# Patient Record
Sex: Female | Born: 1960
Health system: Southern US, Community
[De-identification: ages and names within clinical notes are randomized; demographics above are authoritative.]

## PROBLEM LIST (undated history)

## (undated) DIAGNOSIS — I10 Essential (primary) hypertension: Secondary | ICD-10-CM

## (undated) DIAGNOSIS — E119 Type 2 diabetes mellitus without complications: Secondary | ICD-10-CM

## (undated) HISTORY — PX: ABDOMINAL HYSTERECTOMY: SHX81

---

## 1997-09-17 ENCOUNTER — Other Ambulatory Visit: Admission: RE | Admit: 1997-09-17 | Discharge: 1997-09-17 | Payer: Self-pay | Admitting: Obstetrics & Gynecology

## 1998-03-20 ENCOUNTER — Other Ambulatory Visit: Admission: RE | Admit: 1998-03-20 | Discharge: 1998-03-20 | Payer: Self-pay | Admitting: Otolaryngology

## 1998-07-19 ENCOUNTER — Encounter: Admission: RE | Admit: 1998-07-19 | Discharge: 1998-10-17 | Payer: Self-pay | Admitting: Internal Medicine

## 1998-09-26 ENCOUNTER — Other Ambulatory Visit: Admission: RE | Admit: 1998-09-26 | Discharge: 1998-09-26 | Payer: Self-pay | Admitting: Obstetrics & Gynecology

## 1999-11-25 ENCOUNTER — Other Ambulatory Visit: Admission: RE | Admit: 1999-11-25 | Discharge: 1999-11-25 | Payer: Self-pay | Admitting: Obstetrics & Gynecology

## 2000-04-22 ENCOUNTER — Ambulatory Visit (HOSPITAL_COMMUNITY): Admission: RE | Admit: 2000-04-22 | Discharge: 2000-04-22 | Payer: Self-pay | Admitting: Gastroenterology

## 2000-04-29 ENCOUNTER — Encounter: Payer: Self-pay | Admitting: Gastroenterology

## 2000-04-29 ENCOUNTER — Encounter: Admission: RE | Admit: 2000-04-29 | Discharge: 2000-04-29 | Payer: Self-pay | Admitting: Gastroenterology

## 2000-05-11 ENCOUNTER — Ambulatory Visit (HOSPITAL_COMMUNITY): Admission: RE | Admit: 2000-05-11 | Discharge: 2000-05-11 | Payer: Self-pay | Admitting: Gastroenterology

## 2000-05-11 ENCOUNTER — Encounter: Payer: Self-pay | Admitting: Gastroenterology

## 2000-05-25 ENCOUNTER — Other Ambulatory Visit: Admission: RE | Admit: 2000-05-25 | Discharge: 2000-05-25 | Payer: Self-pay | Admitting: General Surgery

## 2000-05-25 ENCOUNTER — Encounter (INDEPENDENT_AMBULATORY_CARE_PROVIDER_SITE_OTHER): Payer: Self-pay | Admitting: Specialist

## 2000-08-17 ENCOUNTER — Other Ambulatory Visit: Admission: RE | Admit: 2000-08-17 | Discharge: 2000-08-17 | Payer: Self-pay | Admitting: Obstetrics & Gynecology

## 2000-08-17 ENCOUNTER — Encounter (INDEPENDENT_AMBULATORY_CARE_PROVIDER_SITE_OTHER): Payer: Self-pay | Admitting: Specialist

## 2000-10-09 ENCOUNTER — Emergency Department (HOSPITAL_COMMUNITY): Admission: EM | Admit: 2000-10-09 | Discharge: 2000-10-09 | Payer: Self-pay | Admitting: Emergency Medicine

## 2000-10-09 ENCOUNTER — Encounter: Payer: Self-pay | Admitting: Emergency Medicine

## 2001-11-04 ENCOUNTER — Encounter: Payer: Self-pay | Admitting: Emergency Medicine

## 2001-11-04 ENCOUNTER — Emergency Department (HOSPITAL_COMMUNITY): Admission: EM | Admit: 2001-11-04 | Discharge: 2001-11-04 | Payer: Self-pay | Admitting: Emergency Medicine

## 2001-11-11 ENCOUNTER — Ambulatory Visit (HOSPITAL_COMMUNITY): Admission: RE | Admit: 2001-11-11 | Discharge: 2001-11-11 | Payer: Self-pay | Admitting: Orthopedic Surgery

## 2001-11-11 ENCOUNTER — Encounter: Payer: Self-pay | Admitting: Orthopedic Surgery

## 2001-11-15 ENCOUNTER — Ambulatory Visit (HOSPITAL_BASED_OUTPATIENT_CLINIC_OR_DEPARTMENT_OTHER): Admission: RE | Admit: 2001-11-15 | Discharge: 2001-11-15 | Payer: Self-pay | Admitting: Orthopedic Surgery

## 2002-05-09 ENCOUNTER — Other Ambulatory Visit: Admission: RE | Admit: 2002-05-09 | Discharge: 2002-05-09 | Payer: Self-pay | Admitting: Obstetrics & Gynecology

## 2003-06-06 ENCOUNTER — Other Ambulatory Visit: Admission: RE | Admit: 2003-06-06 | Discharge: 2003-06-06 | Payer: Self-pay | Admitting: Obstetrics & Gynecology

## 2003-08-14 ENCOUNTER — Ambulatory Visit (HOSPITAL_COMMUNITY): Admission: RE | Admit: 2003-08-14 | Discharge: 2003-08-14 | Payer: Self-pay | Admitting: Obstetrics & Gynecology

## 2004-07-30 ENCOUNTER — Other Ambulatory Visit: Admission: RE | Admit: 2004-07-30 | Discharge: 2004-07-30 | Payer: Self-pay | Admitting: Obstetrics & Gynecology

## 2004-10-14 ENCOUNTER — Other Ambulatory Visit: Admission: RE | Admit: 2004-10-14 | Discharge: 2004-10-14 | Payer: Self-pay | Admitting: Obstetrics & Gynecology

## 2004-12-22 ENCOUNTER — Ambulatory Visit (HOSPITAL_COMMUNITY): Admission: RE | Admit: 2004-12-22 | Discharge: 2004-12-22 | Payer: Self-pay | Admitting: Gastroenterology

## 2005-03-04 ENCOUNTER — Ambulatory Visit (HOSPITAL_COMMUNITY): Admission: RE | Admit: 2005-03-04 | Discharge: 2005-03-04 | Payer: Self-pay | Admitting: Gastroenterology

## 2005-03-04 ENCOUNTER — Encounter (INDEPENDENT_AMBULATORY_CARE_PROVIDER_SITE_OTHER): Payer: Self-pay | Admitting: Specialist

## 2005-03-23 ENCOUNTER — Ambulatory Visit: Payer: Self-pay | Admitting: Hematology and Oncology

## 2005-04-10 LAB — CBC WITH DIFFERENTIAL/PLATELET
BASO%: 0.8 % (ref 0.0–2.0)
EOS%: 2 % (ref 0.0–7.0)
MCH: 29.2 pg (ref 26.0–34.0)
MCHC: 33.8 g/dL (ref 32.0–36.0)
MCV: 86.4 fL (ref 81.0–101.0)
MONO%: 4.5 % (ref 0.0–13.0)
NEUT#: 5.1 10*3/uL (ref 1.5–6.5)
RBC: 4.62 10*6/uL (ref 3.70–5.32)
RDW: 14.7 % — ABNORMAL HIGH (ref 11.3–14.5)

## 2006-08-21 ENCOUNTER — Emergency Department (HOSPITAL_COMMUNITY): Admission: EM | Admit: 2006-08-21 | Discharge: 2006-08-21 | Payer: Self-pay | Admitting: Emergency Medicine

## 2007-04-17 ENCOUNTER — Emergency Department (HOSPITAL_COMMUNITY): Admission: EM | Admit: 2007-04-17 | Discharge: 2007-04-17 | Payer: Self-pay | Admitting: Emergency Medicine

## 2007-05-02 ENCOUNTER — Emergency Department (HOSPITAL_COMMUNITY): Admission: EM | Admit: 2007-05-02 | Discharge: 2007-05-02 | Payer: Self-pay | Admitting: Emergency Medicine

## 2007-05-06 ENCOUNTER — Encounter: Admission: RE | Admit: 2007-05-06 | Discharge: 2007-05-06 | Payer: Self-pay | Admitting: Neurosurgery

## 2007-09-19 ENCOUNTER — Encounter: Admission: RE | Admit: 2007-09-19 | Discharge: 2007-09-19 | Payer: Self-pay | Admitting: Obstetrics and Gynecology

## 2008-07-03 ENCOUNTER — Emergency Department: Payer: Self-pay | Admitting: Emergency Medicine

## 2008-09-20 ENCOUNTER — Encounter: Admission: RE | Admit: 2008-09-20 | Discharge: 2008-09-20 | Payer: Self-pay | Admitting: Obstetrics and Gynecology

## 2010-05-23 NOTE — Op Note (Signed)
Jenna Nelson, Jenna Nelson                           ACCOUNT NO.:  [id number]   MEDICAL RECORD NO.:  [id number]                   PATIENT TYPE:  AMB   LOCATION:  DSC                                  FACILITY:  [facility]   PHYSICIAN:  Jenna Fitch. Naaman Plummer., M.D.          DATE OF BIRTH:  06-01-1960   DATE OF PROCEDURE:  11/15/2001  DATE OF DISCHARGE:                                 OPERATIVE REPORT   PREOPERATIVE DIAGNOSIS:  Acute gamekeeper's thumb injury, right thumb  metacarpophalangeal joint, due to on-the-job fall, with greater than 35  degrees radial deviation instability of metacarpophalangeal joint and  positive MRI documenting rupture of the distal insertion of the ulnar  collateral ligament.   POSTOPERATIVE DIAGNOSIS:  Acute gamekeeper's thumb injury, right thumb  metacarpophalangeal joint, due to on-the-job fall, with greater than 35  degrees radial deviation instability of metacarpophalangeal joint and  positive MRI documenting rupture of the distal insertion of the ulnar  collateral ligament.   PROCEDURE:  Reconstruction of right thumb ulnar collateral ligament distal  insertion with a 3-0 Kevlar through-bone suture.   SURGEON:  Jenna Fitch. Sypher, M.D.   ASSISTANT:  Jonni Sanger, P.A.   ANESTHESIA:  General by LMA supervised by the anesthesiologist, Maren Beach, M.D.   INDICATIONS:  The patient is a 50 year old employee of the [employer].   Approximately one week ago she fell full weightbearing onto her right hand  while on the job, sustaining a marked radial abduction injury to the  metacarpophalangeal joint.   She had acute pain, swelling, and discoloration consistent with marked  bruising.   She presented for evaluation and was noted on plain films to have no sign of  fracture.  She had frank instability and a palpable mass consistent with an  avulsed distal ulnar collateral ligament insertion.   She was referred for an MRI.   She was  noted to have a complete avulsion of the distal insertion of the  ulnar collateral ligament; however, she did not have a classic Stener  lesion.  She did, however, show marked radial deviation instability;  therefore, she is brought to the operating room at this time for  reconstruction of her right dominant thumb ulnar collateral ligament.   DESCRIPTION OF PROCEDURE:  The patient is brought to the operating room and  placed in the supine position on the operating table.  Following induction  of general anesthesia by LMA, the right arm was prepped with Betadine soap  and solution and sterilely draped.   Following exsanguination of the limb with an Esmarch bandage, an arterial  tourniquet on the proximal brachium was inflated to 220 mmHg.  The procedure  commenced with a lazy S incision exposing the ulnar aspect of the right  thumb metacarpophalangeal joint and extensor retinaculum.   The transverse retinacular fibers of the adductor were identified and the  neurovascular structures gently retracted.  Care was taken to gently retract  the radial sensory branches.   The avulsed ligament was palpable.   The adductor tendon was taken down sharply and the avulsion of the distal  insertion of the ulnar collateral ligament easily visualized.   Granulation tissues were cleaned from the site of the avulsion, followed by  use of a microcurette to decorticate the bone at the insertion point.   The ulnar collateral ligament was gathered with a 3-0 Fibrewire Kevlar  suture with grasping technique, followed by placement of two drill holes  across the proximal phalanx utilizing a 0.045 inch Kirschner wire, followed  by use of a Keith needle.   A small counter incision was fashioned on the radial aspect of the thumb  proximal phalanx, used to tension the sutures.   The repair was replaced anatomically with excellent tension and inset into  the decorticated bone cavity.   Prior to tying the  sutures, the joint was fixed in slight ulnar deviation in  approximately 15 degrees flexion and secured with a 0.045 inch Kirschner  wire across the metacarpal head into the proximal phalanx.   The suture was then tensioned appropriately and the repair was noted to be  anatomic.   The adductor tendon and transverse retinacular fibers were then repaired  with mattress sutures of 4-0 Mersilene.  The skin was repaired with  intradermal 3-0 Prolene.   A compressive dressing was applied with a volar plaster splint supporting  the wrist in 15 degrees dorsiflexion and a thumb spica splint protecting the  MP joint.  There were no apparent complications.  The patient tolerated the  surgery and anesthesia well.  She was transferred to the recovery room with  stable vital signs.                                               Jenna Fitch Naaman Plummer., M.D.    RVS/MEDQ  D:  11/15/2001  T:  11/16/2001  Job:  [id number]

## 2010-05-23 NOTE — Op Note (Signed)
Jenna Nelson, Jenna Nelson               ACCOUNT NO.:  [id number]   MEDICAL RECORD NO.:  [id number]          PATIENT TYPE:  AMB   LOCATION:  ENDO                         FACILITY:  [facility]   PHYSICIAN:  Anselmo Rod, M.D.  DATE OF BIRTH:  August 23, 1960   DATE OF PROCEDURE:  03/04/2005  DATE OF DISCHARGE:                                 OPERATIVE REPORT   PROCEDURE PERFORMED:  Colonoscopy.   ENDOSCOPIST:  Anselmo Rod, M.D.   INSTRUMENT USED:  Olympus video colonoscope.   INDICATIONS FOR PROCEDURE:  A 50 year old white female with a history of  iron deficiency anemia undergoing colonoscopy to rule out colonic polyps,  masses, etc.   PREPROCEDURE PREPARATION:  Informed consent was procured from the patient.  The patient was fasted for four hours prior to the procedure after being  prepped with OsmoPrep pills the night of and the morning of the procedure.  The risks and benefits of the procedure including a 10% miss rate for cancer  or polyps was discussed with the patient as well.   PREPROCEDURE PHYSICAL:  The patient had stable vital signs.  Neck supple.  Chest clear to auscultation.  S1 and S2 regular.  Abdomen soft with normal  bowel sounds.   DESCRIPTION OF PROCEDURE:  The patient was placed in left lateral decubitus  position and sedated with an additional 50 mcg of fentanyl.  Once the  patient was adequately sedated and maintained on low flow oxygen and  continuous cardiac monitoring, the Olympus video colonoscope was advanced  from the rectum to the cecum.  The appendicular orifice and ileocecal valve  were clearly visualized and photographed.  The terminal ileum appeared  healthy and without lesions.  No masses, polyps, erosions, ulcerations or  diverticula were seen.  Small internal hemorrhoids were seen on retroflexion  in the rectum.  The patient tolerated the procedure well without  complication.   IMPRESSION:  1.  Normal colonoscopy up to the terminal ileum except for  small nonbleeding      internal hemorrhoids.  No masses, polyps, or diverticula seen.   RECOMMENDATIONS:  1.  Continue high fiber diet with liberal fluid intake.  2.  Repeat colonoscopy is recommended in the next 10 years unless the      patient develops any abnormal symptoms in the interim.  3.  Outpatient followup in the next two weeks for further recommendations.      Anselmo Rod, M.D.  Electronically Signed     JNM/MEDQ  D:  03/04/2005  T:  03/04/2005  Job:  [id number]   cc:   Loraine Leriche A. Perini, M.D.  Fax: [phone]

## 2010-05-23 NOTE — Procedures (Signed)
[place]. [facility]  Patient:    [name], Jenna Nelson                        MRN: [id number] Proc. Date: 04/22/00 Adm. Date:  [id number] Attending:  Charna Elizabeth CC:         Lilly Cove, M.D.   Procedure Report  DATE OF BIRTH:  24-Apr-1960.  PROCEDURE:  Esophagogastroduodenoscopy.  ENDOSCOPIST:  Anselmo Rod, M.D.  INSTRUMENT USED:  Olympus video panendoscope.  INDICATION FOR PROCEDURE:  Dysphagia and history of iron deficiency anemia in a 50 year old white female with a history of epigastric discomfort.  Rule out peptic ulcer disease, esophagitis, gastritis, etc.  PREPROCEDURE PREPARATION:  Informed consent was procured from the patient. The patient was fasted for eight hours prior to the procedure.  PREPROCEDURE PHYSICAL:  VITAL SIGNS:  The patient had stable vital signs.  NECK:  Supple.  CHEST:  Clear to auscultation.  S1, S2 regular.  ABDOMEN:  Soft with normal abdominal bowel sounds.  DESCRIPTION OF PROCEDURE:  The patient was placed in the left lateral decubitus position and sedated with 100 mg of Demerol and 10 mg of Versed in slow incremental doses.  Once the patient was adequately sedate and maintained on low-flow oxygen and continuous cardiac monitoring, the Olympus video panendoscope was advanced through the mouthpiece, over the tongue, into the esophagus under direct vision.  The proximal esophagus appeared normal.  There was grade 2 distal esophagitis.  There was no frank bleeding from the distal esophagus.  A small hiatal hernia was seen on high retroflexion.  The rest of the gastric mucosa and the proximal small bowel appeared normal.  IMPRESSION: 1. Distal esophagitis. 2. Small hiatal hernia. 3. Normal-appearing stomach and proximal small bowel.  RECOMMENDATIONS: 1. Carafate slurry 1 g q.i.d. has been prescribed for the patient for the    next 15 days. 2. Nexium 40 mg one p.o. q.d. is being prescribed for the  patient, #30 with    three refills. 3. Antireflux measures along with avoidance of nonsteroidals has been    emphasized. 4. Proceed with colonoscopy at this time. DD:  04/22/00 TD:  04/23/00 Job: [id number]

## 2010-05-23 NOTE — Procedures (Signed)
[place]. [facility]  Patient:    Jenna Nelson, Jenna Nelson                        MRN: [id number] Proc. Date: 04/22/00 Adm. Date:  [id number] Attending:  Charna Elizabeth CC:         Lilly Cove, M.D.   Procedure Report  DATE OF BIRTH:  09-18-1960.  PROCEDURE:  Colonoscopy.  ENDOSCOPIST:  Anselmo Rod, M.D.  INSTRUMENT USED:  Olympus video colonoscope.  INDICATION FOR PROCEDURE:  Iron deficiency anemia in a 50 year old white female.  Rule out colonic polyps, masses, hemorrhoids, etc.  PREPROCEDURE PREPARATION:  Informed consent was procured from the patient. The patient was fasted for eight hours prior to the procedure and prepped with a bottle of magnesium citrate and a gallon of NuLytely the night prior to the procedure.  PREPROCEDURE PHYSICAL:  VITAL SIGNS:  The patient had stable vital signs.  NECK:  Supple.  CHEST:  Clear to auscultation.  S1, S2 regular.  ABDOMEN:  Soft with normal abdominal bowel sounds.  DESCRIPTION OF PROCEDURE:  The patient was placed in the left lateral decubitus position and sedated with an additional 25 mg of Demerol and 5 mg of Versed intravenously.  Once the patient was adequately sedate and maintained on low-flow oxygen and continuous cardiac monitoring, the Olympus video colonoscope was advanced from the rectum to the cecum without difficulty.  The entire colonic mucosa appeared healthy with normal vascular pattern.  No erosions, ulcerations, masses, or polyps were seen.  There were small internal hemorrhoids seen on retroflexion.  The patient tolerated the procedure well without complication.  IMPRESSION:  Normal colonoscopy except for small, nonbleeding internal hemorrhoid.  RECOMMENDATIONS: 1. A small-bowel follow-through will be planned for the patient. 2. Outpatient follow-up is advised in the next two weeks. DD:  04/22/00 TD:  04/23/00 Job: [id number]

## 2010-05-23 NOTE — Op Note (Signed)
NAMEMELINDA, Jenna Nelson               ACCOUNT NO.:  [id number]   MEDICAL RECORD NO.:  [id number]          PATIENT TYPE:  AMB   LOCATION:  ENDO                         FACILITY:  [facility]   PHYSICIAN:  Anselmo Rod, M.D.  DATE OF BIRTH:  [birth year]   DATE OF PROCEDURE:  03/04/2005  DATE OF DISCHARGE:  12/22/2004                                 OPERATIVE REPORT   PROCEDURE PERFORMED:  Esophagogastroduodenoscopy with multiple biopsies.   ENDOSCOPIST:  Charna Elizabeth, M.D.   INSTRUMENT USED:  Olympus video panendoscope.   INDICATIONS FOR PROCEDURE:  The patient is a 50 year old white female with a  history of iron deficiency anemia undergoing EGD to rule out peptic ulcer  disease, esophagitis, gastritis, etc.  Small bowel biopsies are planned to  rule out sprue.   PREPROCEDURE PREPARATION:  Informed consent was procured from the patient.  The patient was fasted for four hours prior to the procedure.  The risks and  benefits of the procedure were discussed with her in great detail.   PREPROCEDURE PHYSICAL:  The patient had stable vital signs.  Neck supple,  chest clear to auscultation.  S1, S2 regular.  Abdomen soft with normal  bowel sounds.   DESCRIPTION OF PROCEDURE:  The patient was placed in the left lateral  decubitus position and sedated with 150 mcg of fentanyl and 20 mg of Versed  in slow incremental doses given intravenously.  Once the patient was  adequately sedated and maintained on low-flow oxygen and continuous cardiac  monitoring, the Olympus video panendoscope was advanced through the mouth  piece over the tongue into the esophagus under direct vision.  The entire  esophagus appeared normal with no evidence of ring, stricture, masses,  esophagitis or Barrett's mucosa.  The scope was then advanced to the  stomach.  A small hiatal hernia was seen on high retroflexion.  There was  diffuse gastritis noted throughout the gastric mucosa with more prominent  changes in the  midbody and the antrum.  Small areas of old heme were also  noted in the midbody of the stomach.  Gastric biopsies were done to rule out  presence of Helicobacter pylori by pathology.  Two superficial ulcers were  noted in the duodenal bulb.  Small bowel distal to the bulb was biopsied to  rule out sprue.  Small bowel distal to the bulb appeared normal but biopsies  were done, nevertheless to further evaluate her iron deficiency anemia.  She  has had a history of this for a long time.  There was no outlet obstruction.  The patient tolerated the procedure well without immediate complications.   IMPRESSION:  1.  Normal-appearing esophagus.  2.  Small hiatal hernia.  3.  Diffuse gastritis with old heme in midbody and antrum, biopsies done to      rule out Helicobacter pylori by pathology.  4.  Small bowel biopsy done to rule out sprue.  5.  Two superficial ulcers noted in the duodenal bulb.   RECOMMENDATIONS:  1.  Await pathology results.  2.  Avoid all nonsteroidals including aspirin for now.  3.  Treat with antibiotics if Helicobacter pylori present on biopsies.  4.  Proton pump inhibitor of choice.  5.  Proceed with colonoscopy at this time, further recommendations will be      made thereafter.      Anselmo Rod, M.D.  Electronically Signed     JNM/MEDQ  D:  03/04/2005  T:  03/04/2005  Job:  [id number]   cc:   Loraine Leriche A. Perini, M.D.  Fax: [phone]

## 2010-09-30 LAB — POCT URINALYSIS DIP (DEVICE)
Nitrite: NEGATIVE
Protein, ur: NEGATIVE
pH: 5.5

## 2010-09-30 LAB — URINALYSIS, ROUTINE W REFLEX MICROSCOPIC
Bilirubin Urine: NEGATIVE
Hgb urine dipstick: NEGATIVE
Ketones, ur: NEGATIVE
Nitrite: NEGATIVE
Urobilinogen, UA: 0.2

## 2010-09-30 LAB — POCT PREGNANCY, URINE
Operator id: 235561
Preg Test, Ur: NEGATIVE

## 2010-10-17 LAB — BASIC METABOLIC PANEL
CO2: 20
Calcium: 9
Creatinine, Ser: 0.85
GFR calc Af Amer: >60
GFR calc non Af Amer: >60
Sodium: 140

## 2010-10-17 LAB — I-STAT 8, (EC8 V) (CONVERTED LAB)
Acid-base deficit: 10 — ABNORMAL HIGH
BUN: 10
BUN: 87 — ABNORMAL HIGH
Chloride: 107
Chloride: 113 — ABNORMAL HIGH
Glucose, Bld: 103 — ABNORMAL HIGH
HCT: 33 — ABNORMAL LOW
Hemoglobin: 11.2 — ABNORMAL LOW
Operator id: 196461
Potassium: 3.5
Potassium: 5
Sodium: 137
pCO2, Ven: 30.1 — ABNORMAL LOW
pCO2, Ven: 36.4 — ABNORMAL LOW
pH, Ven: 7.409 — ABNORMAL HIGH

## 2010-10-17 LAB — CBC
HCT: 38.5
MCHC: 33.6
MCV: 86.5
RBC: 4.45

## 2010-10-17 LAB — DIFFERENTIAL
Basophils Relative: 1
Eosinophils Absolute: 0.1
Eosinophils Relative: 2
Lymphs Abs: 1.9
Monocytes Absolute: 0.3
Monocytes Relative: 5
Neutrophils Relative %: 67

## 2010-10-17 LAB — URINALYSIS, ROUTINE W REFLEX MICROSCOPIC
Bilirubin Urine: NEGATIVE
Glucose, UA: NEGATIVE
Hgb urine dipstick: NEGATIVE
Specific Gravity, Urine: 1.02
Urobilinogen, UA: 0.2

## 2010-10-17 LAB — POCT I-STAT CREATININE: Creatinine, Ser: 0.9

## 2010-10-22 ENCOUNTER — Other Ambulatory Visit: Payer: Self-pay | Admitting: Obstetrics & Gynecology

## 2010-10-22 DIAGNOSIS — R928 Other abnormal and inconclusive findings on diagnostic imaging of breast: Secondary | ICD-10-CM

## 2010-11-04 ENCOUNTER — Ambulatory Visit
Admission: RE | Admit: 2010-11-04 | Discharge: 2010-11-04 | Disposition: A | Discharge status: Home / Self Care | Payer: Federal, State, Local not specified - PPO | Source: Ambulatory Visit | Attending: Obstetrics & Gynecology | Admitting: Obstetrics & Gynecology

## 2010-11-04 DIAGNOSIS — R928 Other abnormal and inconclusive findings on diagnostic imaging of breast: Secondary | ICD-10-CM

## 2011-09-29 ENCOUNTER — Other Ambulatory Visit: Payer: Self-pay

## 2011-10-27 ENCOUNTER — Other Ambulatory Visit: Payer: Self-pay | Admitting: Obstetrics & Gynecology

## 2011-10-27 DIAGNOSIS — R928 Other abnormal and inconclusive findings on diagnostic imaging of breast: Secondary | ICD-10-CM

## 2011-10-28 ENCOUNTER — Ambulatory Visit
Admission: RE | Admit: 2011-10-28 | Discharge: 2011-10-28 | Disposition: A | Discharge status: Home / Self Care | Payer: Federal, State, Local not specified - PPO | Source: Ambulatory Visit | Attending: Obstetrics & Gynecology | Admitting: Obstetrics & Gynecology

## 2011-10-28 DIAGNOSIS — R928 Other abnormal and inconclusive findings on diagnostic imaging of breast: Secondary | ICD-10-CM

## 2013-06-18 ENCOUNTER — Emergency Department: Payer: Self-pay | Admitting: Emergency Medicine

## 2014-01-08 ENCOUNTER — Other Ambulatory Visit: Payer: Self-pay | Admitting: Obstetrics & Gynecology

## 2014-01-09 LAB — CYTOLOGY - PAP

## 2016-03-04 DIAGNOSIS — F411 Generalized anxiety disorder: Secondary | ICD-10-CM | POA: Diagnosis not present

## 2016-03-05 DIAGNOSIS — E784 Other hyperlipidemia: Secondary | ICD-10-CM | POA: Diagnosis not present

## 2016-03-05 DIAGNOSIS — E119 Type 2 diabetes mellitus without complications: Secondary | ICD-10-CM | POA: Diagnosis not present

## 2016-03-05 DIAGNOSIS — D126 Benign neoplasm of colon, unspecified: Secondary | ICD-10-CM | POA: Diagnosis not present

## 2016-03-05 DIAGNOSIS — I10 Essential (primary) hypertension: Secondary | ICD-10-CM | POA: Diagnosis not present

## 2016-12-07 DIAGNOSIS — Z0000 Encounter for general adult medical examination without abnormal findings: Secondary | ICD-10-CM | POA: Diagnosis not present

## 2016-12-07 DIAGNOSIS — E7849 Other hyperlipidemia: Secondary | ICD-10-CM | POA: Diagnosis not present

## 2016-12-07 DIAGNOSIS — E119 Type 2 diabetes mellitus without complications: Secondary | ICD-10-CM | POA: Diagnosis not present

## 2016-12-07 DIAGNOSIS — I10 Essential (primary) hypertension: Secondary | ICD-10-CM | POA: Diagnosis not present

## 2016-12-09 DIAGNOSIS — Z0000 Encounter for general adult medical examination without abnormal findings: Secondary | ICD-10-CM | POA: Diagnosis not present

## 2016-12-09 DIAGNOSIS — L817 Pigmented purpuric dermatosis: Secondary | ICD-10-CM | POA: Diagnosis not present

## 2016-12-09 DIAGNOSIS — F3289 Other specified depressive episodes: Secondary | ICD-10-CM | POA: Diagnosis not present

## 2016-12-09 DIAGNOSIS — Z23 Encounter for immunization: Secondary | ICD-10-CM | POA: Diagnosis not present

## 2016-12-09 DIAGNOSIS — E119 Type 2 diabetes mellitus without complications: Secondary | ICD-10-CM | POA: Diagnosis not present

## 2016-12-09 DIAGNOSIS — M25511 Pain in right shoulder: Secondary | ICD-10-CM | POA: Diagnosis not present

## 2016-12-09 DIAGNOSIS — Z1389 Encounter for screening for other disorder: Secondary | ICD-10-CM | POA: Diagnosis not present

## 2017-03-10 DIAGNOSIS — E119 Type 2 diabetes mellitus without complications: Secondary | ICD-10-CM | POA: Diagnosis not present

## 2017-03-10 DIAGNOSIS — E668 Other obesity: Secondary | ICD-10-CM | POA: Diagnosis not present

## 2017-03-10 DIAGNOSIS — I10 Essential (primary) hypertension: Secondary | ICD-10-CM | POA: Diagnosis not present

## 2017-03-10 DIAGNOSIS — F3289 Other specified depressive episodes: Secondary | ICD-10-CM | POA: Diagnosis not present

## 2017-04-17 ENCOUNTER — Emergency Department
Admission: EM | Admit: 2017-04-17 | Discharge: 2017-04-17 | Disposition: A | Discharge status: Home / Self Care | Payer: Federal, State, Local not specified - PPO | Attending: Emergency Medicine | Admitting: Emergency Medicine

## 2017-04-17 ENCOUNTER — Encounter: Payer: Self-pay | Admitting: Medical Oncology

## 2017-04-17 ENCOUNTER — Other Ambulatory Visit: Payer: Self-pay

## 2017-04-17 ENCOUNTER — Emergency Department: Payer: Federal, State, Local not specified - PPO

## 2017-04-17 DIAGNOSIS — R000 Tachycardia, unspecified: Secondary | ICD-10-CM | POA: Diagnosis not present

## 2017-04-17 DIAGNOSIS — R079 Chest pain, unspecified: Secondary | ICD-10-CM | POA: Diagnosis not present

## 2017-04-17 DIAGNOSIS — R531 Weakness: Secondary | ICD-10-CM

## 2017-04-17 DIAGNOSIS — E1165 Type 2 diabetes mellitus with hyperglycemia: Secondary | ICD-10-CM | POA: Insufficient documentation

## 2017-04-17 DIAGNOSIS — R0789 Other chest pain: Secondary | ICD-10-CM | POA: Diagnosis not present

## 2017-04-17 DIAGNOSIS — R739 Hyperglycemia, unspecified: Secondary | ICD-10-CM

## 2017-04-17 DIAGNOSIS — I10 Essential (primary) hypertension: Secondary | ICD-10-CM | POA: Insufficient documentation

## 2017-04-17 DIAGNOSIS — E860 Dehydration: Secondary | ICD-10-CM | POA: Insufficient documentation

## 2017-04-17 DIAGNOSIS — R0602 Shortness of breath: Secondary | ICD-10-CM | POA: Diagnosis not present

## 2017-04-17 HISTORY — DX: Type 2 diabetes mellitus without complications: E11.9

## 2017-04-17 HISTORY — DX: Essential (primary) hypertension: I10

## 2017-04-17 LAB — LIPASE, BLOOD: Lipase: 22 U/L (ref 11–51)

## 2017-04-17 LAB — TROPONIN I: Troponin I: <0.03 ng/mL (ref <0.03)

## 2017-04-17 LAB — HEPATIC FUNCTION PANEL
ALT: 35 U/L (ref 14–54)
AST: 44 U/L — ABNORMAL HIGH (ref 15–41)
Albumin: 4.5 g/dL (ref 3.5–5.0)
Alkaline Phosphatase: 125 U/L (ref 38–126)
BILIRUBIN DIRECT: 0.3 mg/dL (ref 0.1–0.5)
BILIRUBIN INDIRECT: 1.7 mg/dL — AB (ref 0.3–0.9)
Total Bilirubin: 2 mg/dL — ABNORMAL HIGH (ref 0.3–1.2)
Total Protein: 8.1 g/dL (ref 6.5–8.1)

## 2017-04-17 LAB — CBC
HEMATOCRIT: 48.6 % — AB (ref 35.0–47.0)
Hemoglobin: 16.1 g/dL — ABNORMAL HIGH (ref 12.0–16.0)
MCH: 29.1 pg (ref 26.0–34.0)
MCHC: 33.2 g/dL (ref 32.0–36.0)
MCV: 87.9 fL (ref 80.0–100.0)
Platelets: 269 10*3/uL (ref 150–440)
RBC: 5.53 MIL/uL — ABNORMAL HIGH (ref 3.80–5.20)
RDW: 14.7 % — AB (ref 11.5–14.5)
WBC: 8.3 10*3/uL (ref 3.6–11.0)

## 2017-04-17 LAB — BASIC METABOLIC PANEL
Anion gap: 14 (ref 5–15)
BUN: 14 mg/dL (ref 6–20)
CO2: 22 mmol/L (ref 22–32)
Calcium: 9 mg/dL (ref 8.9–10.3)
Chloride: 95 mmol/L — ABNORMAL LOW (ref 101–111)
Creatinine, Ser: 1.1 mg/dL — ABNORMAL HIGH (ref 0.44–1.00)
GFR calc Af Amer: >60 mL/min (ref >60)
GFR, EST NON AFRICAN AMERICAN: 54 mL/min — AB (ref >60)
GLUCOSE: 370 mg/dL — AB (ref 65–99)
POTASSIUM: 3.6 mmol/L (ref 3.5–5.1)
Sodium: 131 mmol/L — ABNORMAL LOW (ref 135–145)

## 2017-04-17 LAB — GLUCOSE, CAPILLARY
GLUCOSE-CAPILLARY: 354 mg/dL — AB (ref 65–99)
GLUCOSE-CAPILLARY: 175 mg/dL — AB (ref 65–99)
Glucose-Capillary: 349 mg/dL — ABNORMAL HIGH (ref 65–99)

## 2017-04-17 MED ORDER — SODIUM CHLORIDE 0.9 % IV BOLUS
1000.0000 mL | Freq: Once | INTRAVENOUS | Status: AC
  Administered 2017-04-17: 1000 mL via INTRAVENOUS

## 2017-04-17 MED ORDER — INSULIN ASPART 100 UNIT/ML ~~LOC~~ SOLN
6.0000 [IU] | Freq: Once | SUBCUTANEOUS | Status: AC
  Administered 2017-04-17: 6 [IU] via INTRAVENOUS
  Filled 2017-04-17: qty 1

## 2017-04-17 NOTE — ED Notes (Signed)
Informed RN that patient has been roomed and is ready for evaluation.  Patient in NAD at this time and call bell placed within reach.   

## 2017-04-17 NOTE — ED Notes (Signed)
ED Provider at bedside. 

## 2017-04-17 NOTE — ED Notes (Signed)
Pt IVF infusing, pt has positional IV which is why there is a delay in infusing in.

## 2017-04-17 NOTE — ED Provider Notes (Signed)
[facility] Emergency Department Provider Note  Time seen: 9:25 AM  I have reviewed the triage vital signs and the nursing notes.   HISTORY  Chief Complaint Weakness; Nausea; and Chest Pain    HPI Jenna Nelson is a 57 y.o. female with a past medical history of diabetes, hypertension, presents to the emergency department for weakness, lightheadedness and chest discomfort.  According to the patient yesterday she was feeling very lightheaded, states every time she stood up she got dizzy and felt like she might pass out.  States she went to her eye doctor and they cannot get a blood pressure on her after multiple attempts they wanted to call 911 but the patient refused.  Patient states she was having some mild chest tightness yesterday as well which she describes as more of a tightness and shortness of breath feeling but only when she stood up from a sitting position.  Denies any chest pain currently.  Continues to feel somewhat lightheaded.  Patient states she was recently diagnosed with diabetes approximately 6 months ago, states her blood sugars have recently been in the 300s.  Her doctor added a third medication but she does not know which medication she is currently taking for the diabetes.  Denies increased urination.  Denies abdominal pain.  States she was nauseated with one episode of vomiting this morning but denies any diarrhea.  Denies dysuria.   Past Medical History:  Diagnosis Date  . Diabetes mellitus without complication (HCC)   . Hypertension     There are no active problems to display for this patient.   Prior to Admission medications   Not on File    Allergies  Allergen Reactions  . Codeine   . Sulfa Antibiotics     No family history on file.  Social History Social History   Tobacco Use  . Smoking status: Not on file  Substance Use Topics  . Alcohol use: Not on file  . Drug use: Not on file    Review of Systems Constitutional:  Negative for fever.  Positive for lightheadedness/dizziness. Eyes: Negative for visual complaints ENT: Positive for dry mouth Cardiovascular: Chest tightness yesterday, now resolved Respiratory: Negative for shortness of breath. Gastrointestinal: Negative for abdominal pain.  One episode of nausea vomiting this morning.  Denies diarrhea. Genitourinary: Negative for dysuria or frequency. Musculoskeletal: Negative for musculoskeletal complaints Skin: Negative for skin complaints  Neurological: Negative for headache All other ROS negative  ____________________________________________   PHYSICAL EXAM:  VITAL SIGNS: ED Triage Vitals  Enc Vitals Group     BP 04/17/17 0745 (!) 154/69     Pulse Rate 04/17/17 0745 (!) 123     Resp 04/17/17 0745 18     Temp 04/17/17 0745 97.6 F (36.4 C)     Temp Source 04/17/17 0745 Oral     SpO2 04/17/17 0745 97 %     Weight 04/17/17 0746 160 lb (72.6 kg)     Height 04/17/17 0746 5\' 1"  (1.549 m)     Head Circumference --      Peak Flow --      Pain Score 04/17/17 0746 4     Pain Loc --      Pain Edu? --      Excl. in GC? --    Constitutional: Alert and oriented. Well appearing and in no distress. Eyes: Normal exam ENT   Head: Normocephalic and atraumatic.   Mouth/Throat: Dry mucous membranes. Cardiovascular: Regular rhythm, rate around 120.  No obvious murmur. Respiratory: Normal respiratory effort without tachypnea nor retractions. Breath sounds are clear and equal bilaterally. No wheezes/rales/rhonchi. Gastrointestinal: Soft and nontender. No distention.   Musculoskeletal: Nontender with normal range of motion in all extremities.  Neurologic:  Normal speech and language. No gross focal neurologic deficits  Skin:  Skin is warm, dry and intact.  Psychiatric: Mood and affect are normal.  ____________________________________________    EKG  EKG reviewed and interpreted by myself shows sinus tachycardia 119 bpm, narrow QRS, normal  axis, largely normal intervals besides slight QTC prolongation.  Nonspecific ST changes.  ____________________________________________    RADIOLOGY  Chest x-ray negative  ____________________________________________   INITIAL IMPRESSION / ASSESSMENT AND PLAN / ED COURSE  Pertinent labs & imaging results that were available during my care of the patient were reviewed by me and considered in my medical decision making (see chart for details).  Patient presents to the emergency department for lightheadedness, dizziness with chest discomfort yesterday.  Differential would include dehydration, hypovolemia, hyperglycemia, DKA, ACS, electrolyte/metabolic abnormality.  Patient's labs are resulted showed an elevated blood glucose greater than 300, with an anion gap of 14.  Highly suspect dehydration to be the cause of most of the patient's symptoms.  Troponin negative.  Chest x-ray negative.  We will start IV hydration for the patient's elevated blood glucose and dehydration.  [facility], states she is new to diabetes, attempted to educate on non-sugary fluid intake.  We will continue to closely monitor and reassess after fluids have infused.   After a small amount of IV insulin and IV fluids patient's blood sugars around 175.  Patient states she is feeling better.  I once again discussed dietary recommendations foods to eat and foods to avoid.  I discussed with the patient checking her blood sugar 3 times a day before each meal and keeping a journal over the next 1 week to see if the patient can maintain her blood sugar below 200 with adequate dietary control.  I discussed following up with her doctor in 1 week for recheck/reevaluation and the possibility of adjusting her diabetic regimen and possibly adding insulin if the patient is unable to control her blood sugar being on her current medications with strict dietary regulation.  Patient agreeable to this plan of  care. ____________________________________________   FINAL CLINICAL IMPRESSION(S) / ED DIAGNOSES  Hyperglycemia Dehydration    Minna AntisPaduchowski, Janyra Barillas, MD 04/17/17 1243

## 2017-04-17 NOTE — ED Triage Notes (Signed)
Pt reports yesterday she began having episodes where she felt like she was going to pass out, she would feel weak, clamy and have some chest tightness. Pt reports recent diagnosis of DM 2 and placed on new meds.

## 2017-04-19 DIAGNOSIS — R0789 Other chest pain: Secondary | ICD-10-CM | POA: Diagnosis not present

## 2017-04-19 DIAGNOSIS — E119 Type 2 diabetes mellitus without complications: Secondary | ICD-10-CM | POA: Diagnosis not present

## 2017-04-19 DIAGNOSIS — F329 Major depressive disorder, single episode, unspecified: Secondary | ICD-10-CM | POA: Diagnosis not present

## 2017-04-19 DIAGNOSIS — I10 Essential (primary) hypertension: Secondary | ICD-10-CM | POA: Diagnosis not present

## 2017-08-16 DIAGNOSIS — L74511 Primary focal hyperhidrosis, face: Secondary | ICD-10-CM | POA: Diagnosis not present

## 2017-08-18 DIAGNOSIS — K080 Exfoliation of teeth due to systemic causes: Secondary | ICD-10-CM | POA: Diagnosis not present

## 2017-08-24 DIAGNOSIS — K219 Gastro-esophageal reflux disease without esophagitis: Secondary | ICD-10-CM | POA: Diagnosis not present

## 2017-08-24 DIAGNOSIS — F3289 Other specified depressive episodes: Secondary | ICD-10-CM | POA: Diagnosis not present

## 2017-08-24 DIAGNOSIS — E119 Type 2 diabetes mellitus without complications: Secondary | ICD-10-CM | POA: Diagnosis not present

## 2017-08-24 DIAGNOSIS — I10 Essential (primary) hypertension: Secondary | ICD-10-CM | POA: Diagnosis not present

## 2017-09-01 DIAGNOSIS — Z1231 Encounter for screening mammogram for malignant neoplasm of breast: Secondary | ICD-10-CM | POA: Diagnosis not present

## 2017-09-01 DIAGNOSIS — Z6830 Body mass index (BMI) 30.0-30.9, adult: Secondary | ICD-10-CM | POA: Diagnosis not present

## 2017-09-01 DIAGNOSIS — Z01419 Encounter for gynecological examination (general) (routine) without abnormal findings: Secondary | ICD-10-CM | POA: Diagnosis not present

## 2017-10-04 DIAGNOSIS — R87612 Low grade squamous intraepithelial lesion on cytologic smear of cervix (LGSIL): Secondary | ICD-10-CM | POA: Diagnosis not present

## 2017-10-04 DIAGNOSIS — R87610 Atypical squamous cells of undetermined significance on cytologic smear of cervix (ASC-US): Secondary | ICD-10-CM | POA: Diagnosis not present

## 2017-10-06 DIAGNOSIS — E119 Type 2 diabetes mellitus without complications: Secondary | ICD-10-CM | POA: Diagnosis not present

## 2017-10-06 DIAGNOSIS — I10 Essential (primary) hypertension: Secondary | ICD-10-CM | POA: Diagnosis not present

## 2017-10-06 DIAGNOSIS — R9431 Abnormal electrocardiogram [ECG] [EKG]: Secondary | ICD-10-CM | POA: Diagnosis not present

## 2017-10-06 DIAGNOSIS — Z794 Long term (current) use of insulin: Secondary | ICD-10-CM | POA: Diagnosis not present

## 2017-10-11 DIAGNOSIS — I10 Essential (primary) hypertension: Secondary | ICD-10-CM | POA: Diagnosis not present

## 2017-10-11 DIAGNOSIS — E119 Type 2 diabetes mellitus without complications: Secondary | ICD-10-CM | POA: Diagnosis not present

## 2017-10-11 DIAGNOSIS — G4700 Insomnia, unspecified: Secondary | ICD-10-CM | POA: Diagnosis not present

## 2017-10-11 DIAGNOSIS — K219 Gastro-esophageal reflux disease without esophagitis: Secondary | ICD-10-CM | POA: Diagnosis not present

## 2017-10-11 DIAGNOSIS — R1110 Vomiting, unspecified: Secondary | ICD-10-CM | POA: Diagnosis not present

## 2017-10-11 DIAGNOSIS — D6489 Other specified anemias: Secondary | ICD-10-CM | POA: Diagnosis not present

## 2017-10-12 DIAGNOSIS — K080 Exfoliation of teeth due to systemic causes: Secondary | ICD-10-CM | POA: Diagnosis not present

## 2017-10-25 DIAGNOSIS — R0609 Other forms of dyspnea: Secondary | ICD-10-CM | POA: Diagnosis not present

## 2017-11-18 DIAGNOSIS — R0609 Other forms of dyspnea: Secondary | ICD-10-CM | POA: Diagnosis not present

## 2017-11-24 DIAGNOSIS — I10 Essential (primary) hypertension: Secondary | ICD-10-CM | POA: Diagnosis not present

## 2017-11-24 DIAGNOSIS — R9431 Abnormal electrocardiogram [ECG] [EKG]: Secondary | ICD-10-CM | POA: Diagnosis not present

## 2017-11-24 DIAGNOSIS — E119 Type 2 diabetes mellitus without complications: Secondary | ICD-10-CM | POA: Diagnosis not present

## 2017-11-24 DIAGNOSIS — Z794 Long term (current) use of insulin: Secondary | ICD-10-CM | POA: Diagnosis not present

## 2018-01-06 DIAGNOSIS — I10 Essential (primary) hypertension: Secondary | ICD-10-CM | POA: Diagnosis not present

## 2018-01-06 DIAGNOSIS — E119 Type 2 diabetes mellitus without complications: Secondary | ICD-10-CM | POA: Diagnosis not present

## 2018-01-06 DIAGNOSIS — Z0000 Encounter for general adult medical examination without abnormal findings: Secondary | ICD-10-CM | POA: Diagnosis not present

## 2018-01-06 DIAGNOSIS — R82998 Other abnormal findings in urine: Secondary | ICD-10-CM | POA: Diagnosis not present

## 2018-01-13 DIAGNOSIS — Z1389 Encounter for screening for other disorder: Secondary | ICD-10-CM | POA: Diagnosis not present

## 2018-01-13 DIAGNOSIS — Z0000 Encounter for general adult medical examination without abnormal findings: Secondary | ICD-10-CM | POA: Diagnosis not present

## 2018-01-13 DIAGNOSIS — I340 Nonrheumatic mitral (valve) insufficiency: Secondary | ICD-10-CM | POA: Diagnosis not present

## 2018-01-13 DIAGNOSIS — I517 Cardiomegaly: Secondary | ICD-10-CM | POA: Diagnosis not present

## 2018-01-13 DIAGNOSIS — E668 Other obesity: Secondary | ICD-10-CM | POA: Diagnosis not present

## 2018-01-13 DIAGNOSIS — Z23 Encounter for immunization: Secondary | ICD-10-CM | POA: Diagnosis not present

## 2018-01-13 DIAGNOSIS — R0789 Other chest pain: Secondary | ICD-10-CM | POA: Diagnosis not present

## 2018-01-27 DIAGNOSIS — E119 Type 2 diabetes mellitus without complications: Secondary | ICD-10-CM | POA: Diagnosis not present

## 2018-05-26 DIAGNOSIS — E038 Other specified hypothyroidism: Secondary | ICD-10-CM | POA: Diagnosis not present

## 2018-05-26 DIAGNOSIS — E1169 Type 2 diabetes mellitus with other specified complication: Secondary | ICD-10-CM | POA: Diagnosis not present

## 2018-05-26 DIAGNOSIS — R946 Abnormal results of thyroid function studies: Secondary | ICD-10-CM | POA: Diagnosis not present

## 2018-06-03 DIAGNOSIS — E039 Hypothyroidism, unspecified: Secondary | ICD-10-CM | POA: Diagnosis not present

## 2018-06-03 DIAGNOSIS — I517 Cardiomegaly: Secondary | ICD-10-CM | POA: Diagnosis not present

## 2018-06-03 DIAGNOSIS — E1169 Type 2 diabetes mellitus with other specified complication: Secondary | ICD-10-CM | POA: Diagnosis not present

## 2018-06-03 DIAGNOSIS — F329 Major depressive disorder, single episode, unspecified: Secondary | ICD-10-CM | POA: Diagnosis not present

## 2018-09-13 DIAGNOSIS — Z01419 Encounter for gynecological examination (general) (routine) without abnormal findings: Secondary | ICD-10-CM | POA: Diagnosis not present

## 2018-09-13 DIAGNOSIS — Z1231 Encounter for screening mammogram for malignant neoplasm of breast: Secondary | ICD-10-CM | POA: Diagnosis not present

## 2018-09-13 DIAGNOSIS — Z6830 Body mass index (BMI) 30.0-30.9, adult: Secondary | ICD-10-CM | POA: Diagnosis not present

## 2018-09-14 DIAGNOSIS — Z01419 Encounter for gynecological examination (general) (routine) without abnormal findings: Secondary | ICD-10-CM | POA: Diagnosis not present

## 2018-09-19 DIAGNOSIS — I517 Cardiomegaly: Secondary | ICD-10-CM | POA: Diagnosis not present

## 2018-09-19 DIAGNOSIS — E039 Hypothyroidism, unspecified: Secondary | ICD-10-CM | POA: Diagnosis not present

## 2018-09-19 DIAGNOSIS — I340 Nonrheumatic mitral (valve) insufficiency: Secondary | ICD-10-CM | POA: Diagnosis not present

## 2018-09-19 DIAGNOSIS — E1169 Type 2 diabetes mellitus with other specified complication: Secondary | ICD-10-CM | POA: Diagnosis not present

## 2018-09-20 DIAGNOSIS — Z23 Encounter for immunization: Secondary | ICD-10-CM | POA: Diagnosis not present

## 2018-10-19 DIAGNOSIS — N762 Acute vulvitis: Secondary | ICD-10-CM | POA: Diagnosis not present

## 2018-10-19 DIAGNOSIS — N760 Acute vaginitis: Secondary | ICD-10-CM | POA: Diagnosis not present

## 2018-10-23 ENCOUNTER — Emergency Department: Payer: Federal, State, Local not specified - PPO

## 2018-10-23 ENCOUNTER — Other Ambulatory Visit: Payer: Self-pay

## 2018-10-23 ENCOUNTER — Inpatient Hospital Stay
Admission: EM | Admit: 2018-10-23 | Discharge: 2018-10-28 | DRG: 871 | Disposition: A | Discharge status: Home / Self Care | Payer: Federal, State, Local not specified - PPO | Attending: Internal Medicine | Admitting: Internal Medicine

## 2018-10-23 DIAGNOSIS — R6510 Systemic inflammatory response syndrome (SIRS) of non-infectious origin without acute organ dysfunction: Secondary | ICD-10-CM | POA: Diagnosis not present

## 2018-10-23 DIAGNOSIS — E1169 Type 2 diabetes mellitus with other specified complication: Secondary | ICD-10-CM | POA: Diagnosis present

## 2018-10-23 DIAGNOSIS — Z882 Allergy status to sulfonamides status: Secondary | ICD-10-CM | POA: Diagnosis not present

## 2018-10-23 DIAGNOSIS — E860 Dehydration: Secondary | ICD-10-CM | POA: Diagnosis not present

## 2018-10-23 DIAGNOSIS — Z7984 Long term (current) use of oral hypoglycemic drugs: Secondary | ICD-10-CM | POA: Diagnosis not present

## 2018-10-23 DIAGNOSIS — E1159 Type 2 diabetes mellitus with other circulatory complications: Secondary | ICD-10-CM

## 2018-10-23 DIAGNOSIS — Z90710 Acquired absence of both cervix and uterus: Secondary | ICD-10-CM | POA: Diagnosis not present

## 2018-10-23 DIAGNOSIS — A419 Sepsis, unspecified organism: Principal | ICD-10-CM | POA: Diagnosis present

## 2018-10-23 DIAGNOSIS — D72829 Elevated white blood cell count, unspecified: Secondary | ICD-10-CM | POA: Diagnosis not present

## 2018-10-23 DIAGNOSIS — G9349 Other encephalopathy: Secondary | ICD-10-CM | POA: Diagnosis present

## 2018-10-23 DIAGNOSIS — E1110 Type 2 diabetes mellitus with ketoacidosis without coma: Secondary | ICD-10-CM | POA: Diagnosis not present

## 2018-10-23 DIAGNOSIS — Z885 Allergy status to narcotic agent status: Secondary | ICD-10-CM | POA: Diagnosis not present

## 2018-10-23 DIAGNOSIS — R000 Tachycardia, unspecified: Secondary | ICD-10-CM | POA: Diagnosis not present

## 2018-10-23 DIAGNOSIS — R109 Unspecified abdominal pain: Secondary | ICD-10-CM | POA: Diagnosis not present

## 2018-10-23 DIAGNOSIS — Z79899 Other long term (current) drug therapy: Secondary | ICD-10-CM

## 2018-10-23 DIAGNOSIS — R4701 Aphasia: Secondary | ICD-10-CM | POA: Diagnosis not present

## 2018-10-23 DIAGNOSIS — R569 Unspecified convulsions: Secondary | ICD-10-CM | POA: Diagnosis not present

## 2018-10-23 DIAGNOSIS — T383X6A Underdosing of insulin and oral hypoglycemic [antidiabetic] drugs, initial encounter: Secondary | ICD-10-CM | POA: Diagnosis present

## 2018-10-23 DIAGNOSIS — R1084 Generalized abdominal pain: Secondary | ICD-10-CM | POA: Diagnosis not present

## 2018-10-23 DIAGNOSIS — I152 Hypertension secondary to endocrine disorders: Secondary | ICD-10-CM | POA: Diagnosis not present

## 2018-10-23 DIAGNOSIS — N12 Tubulo-interstitial nephritis, not specified as acute or chronic: Secondary | ICD-10-CM | POA: Diagnosis not present

## 2018-10-23 DIAGNOSIS — T380X5A Adverse effect of glucocorticoids and synthetic analogues, initial encounter: Secondary | ICD-10-CM | POA: Diagnosis not present

## 2018-10-23 DIAGNOSIS — R29810 Facial weakness: Secondary | ICD-10-CM | POA: Diagnosis not present

## 2018-10-23 DIAGNOSIS — Z20828 Contact with and (suspected) exposure to other viral communicable diseases: Secondary | ICD-10-CM | POA: Diagnosis not present

## 2018-10-23 DIAGNOSIS — E1165 Type 2 diabetes mellitus with hyperglycemia: Secondary | ICD-10-CM | POA: Diagnosis not present

## 2018-10-23 DIAGNOSIS — G4089 Other seizures: Secondary | ICD-10-CM | POA: Diagnosis not present

## 2018-10-23 DIAGNOSIS — I10 Essential (primary) hypertension: Secondary | ICD-10-CM | POA: Diagnosis not present

## 2018-10-23 DIAGNOSIS — R52 Pain, unspecified: Secondary | ICD-10-CM | POA: Diagnosis not present

## 2018-10-23 LAB — URINALYSIS, COMPLETE (UACMP) WITH MICROSCOPIC
Bacteria, UA: NONE SEEN
Bilirubin Urine: NEGATIVE
Glucose, UA: >=500 mg/dL — AB
Ketones, ur: 80 mg/dL — AB
Leukocytes,Ua: NEGATIVE
Nitrite: NEGATIVE
Protein, ur: NEGATIVE mg/dL
Specific Gravity, Urine: 1.026 (ref 1.005–1.030)
pH: 5 (ref 5.0–8.0)

## 2018-10-23 LAB — BASIC METABOLIC PANEL
Anion gap: 25 — ABNORMAL HIGH (ref 5–15)
Anion gap: 14 (ref 5–15)
BUN: 17 mg/dL (ref 6–20)
BUN: 13 mg/dL (ref 6–20)
CO2: 11 mmol/L — ABNORMAL LOW (ref 22–32)
CO2: 15 mmol/L — ABNORMAL LOW (ref 22–32)
Calcium: 8.5 mg/dL — ABNORMAL LOW (ref 8.9–10.3)
Calcium: 8.3 mg/dL — ABNORMAL LOW (ref 8.9–10.3)
Chloride: 96 mmol/L — ABNORMAL LOW (ref 98–111)
Chloride: 106 mmol/L (ref 98–111)
Creatinine, Ser: 0.81 mg/dL (ref 0.44–1.00)
Creatinine, Ser: 0.77 mg/dL (ref 0.44–1.00)
GFR calc Af Amer: >60 mL/min (ref >60)
GFR calc Af Amer: >60 mL/min (ref >60)
GFR calc non Af Amer: >60 mL/min (ref >60)
GFR calc non Af Amer: >60 mL/min (ref >60)
Glucose, Bld: 454 mg/dL — ABNORMAL HIGH (ref 70–99)
Glucose, Bld: 202 mg/dL — ABNORMAL HIGH (ref 70–99)
Potassium: 4.2 mmol/L (ref 3.5–5.1)
Potassium: 4.1 mmol/L (ref 3.5–5.1)
Sodium: 132 mmol/L — ABNORMAL LOW (ref 135–145)
Sodium: 135 mmol/L (ref 135–145)

## 2018-10-23 LAB — CREATININE, SERUM
Creatinine, Ser: 0.71 mg/dL (ref 0.44–1.00)
GFR calc Af Amer: >60 mL/min (ref >60)
GFR calc non Af Amer: >60 mL/min (ref >60)

## 2018-10-23 LAB — HEPATIC FUNCTION PANEL
ALT: 34 U/L (ref 0–44)
AST: 40 U/L (ref 15–41)
Albumin: 4.2 g/dL (ref 3.5–5.0)
Alkaline Phosphatase: 89 U/L (ref 38–126)
Bilirubin, Direct: 0.3 mg/dL — ABNORMAL HIGH (ref 0.0–0.2)
Indirect Bilirubin: 2.1 mg/dL — ABNORMAL HIGH (ref 0.3–0.9)
Total Bilirubin: 2.4 mg/dL — ABNORMAL HIGH (ref 0.3–1.2)
Total Protein: 7.6 g/dL (ref 6.5–8.1)

## 2018-10-23 LAB — MRSA PCR SCREENING: MRSA by PCR: NEGATIVE

## 2018-10-23 LAB — CBC
HCT: 43.3 % (ref 36.0–46.0)
HCT: 41.5 % (ref 36.0–46.0)
Hemoglobin: 14.6 g/dL (ref 12.0–15.0)
Hemoglobin: 13.8 g/dL (ref 12.0–15.0)
MCH: 30 pg (ref 26.0–34.0)
MCH: 29.2 pg (ref 26.0–34.0)
MCHC: 33.7 g/dL (ref 30.0–36.0)
MCHC: 33.3 g/dL (ref 30.0–36.0)
MCV: 89.1 fL (ref 80.0–100.0)
MCV: 87.9 fL (ref 80.0–100.0)
Platelets: 300 10*3/uL (ref 150–400)
Platelets: 249 10*3/uL (ref 150–400)
RBC: 4.86 MIL/uL (ref 3.87–5.11)
RBC: 4.72 MIL/uL (ref 3.87–5.11)
RDW: 13.4 % (ref 11.5–15.5)
RDW: 13.3 % (ref 11.5–15.5)
WBC: 24.3 10*3/uL — ABNORMAL HIGH (ref 4.0–10.5)
WBC: 20.3 10*3/uL — ABNORMAL HIGH (ref 4.0–10.5)
nRBC: 0 % (ref 0.0–0.2)
nRBC: 0 % (ref 0.0–0.2)

## 2018-10-23 LAB — LIPASE, BLOOD: Lipase: 12 U/L (ref 11–51)

## 2018-10-23 LAB — LACTIC ACID, PLASMA
Lactic Acid, Venous: 2.4 mmol/L (ref 0.5–1.9)
Lactic Acid, Venous: 1.5 mmol/L (ref 0.5–1.9)

## 2018-10-23 LAB — GLUCOSE, CAPILLARY
Glucose-Capillary: 462 mg/dL — ABNORMAL HIGH (ref 70–99)
Glucose-Capillary: 343 mg/dL — ABNORMAL HIGH (ref 70–99)
Glucose-Capillary: 265 mg/dL — ABNORMAL HIGH (ref 70–99)
Glucose-Capillary: 152 mg/dL — ABNORMAL HIGH (ref 70–99)
Glucose-Capillary: 153 mg/dL — ABNORMAL HIGH (ref 70–99)

## 2018-10-23 LAB — SARS CORONAVIRUS 2 BY RT PCR (HOSPITAL ORDER, PERFORMED IN ~~LOC~~ HOSPITAL LAB): SARS Coronavirus 2: NEGATIVE

## 2018-10-23 LAB — BETA-HYDROXYBUTYRIC ACID: Beta-Hydroxybutyric Acid: 5.43 mmol/L — ABNORMAL HIGH (ref 0.05–0.27)

## 2018-10-23 MED ORDER — VANCOMYCIN HCL 1.5 G IV SOLR
1500.0000 mg | Freq: Once | INTRAVENOUS | Status: AC
  Administered 2018-10-23: 1500 mg via INTRAVENOUS
  Filled 2018-10-23: qty 1500

## 2018-10-23 MED ORDER — SODIUM CHLORIDE 0.9 % IV SOLN
INTRAVENOUS | Status: DC
  Administered 2018-10-23: 20:00:00 via INTRAVENOUS

## 2018-10-23 MED ORDER — INSULIN REGULAR(HUMAN) IN NACL 100-0.9 UT/100ML-% IV SOLN: INTRAVENOUS | Status: DC

## 2018-10-23 MED ORDER — PIPERACILLIN-TAZOBACTAM 3.375 G IVPB 30 MIN
3.3750 g | Freq: Once | INTRAVENOUS | Status: AC
  Administered 2018-10-23: 3.375 g via INTRAVENOUS
  Filled 2018-10-23: qty 50

## 2018-10-23 MED ORDER — SODIUM CHLORIDE 0.9 % IV SOLN
INTRAVENOUS | Status: AC
  Administered 2018-10-23: 20:00:00 via INTRAVENOUS

## 2018-10-23 MED ORDER — CHLORHEXIDINE GLUCONATE CLOTH 2 % EX PADS
6.0000 | MEDICATED_PAD | Freq: Every day | CUTANEOUS | Status: DC
  Administered 2018-10-23 – 2018-10-26 (×4): 6 via TOPICAL

## 2018-10-23 MED ORDER — DEXTROSE-NACL 5-0.45 % IV SOLN
INTRAVENOUS | Status: DC
  Administered 2018-10-23 – 2018-10-24 (×2): 800 mL via INTRAVENOUS

## 2018-10-23 MED ORDER — IOHEXOL 300 MG/ML  SOLN
100.0000 mL | Freq: Once | INTRAMUSCULAR | Status: AC | PRN
  Administered 2018-10-23: 100 mL via INTRAVENOUS

## 2018-10-23 MED ORDER — POTASSIUM CHLORIDE 10 MEQ/100ML IV SOLN
10.0000 meq | INTRAVENOUS | Status: AC
  Administered 2018-10-23 (×2): 10 meq via INTRAVENOUS
  Filled 2018-10-23 (×2): qty 100

## 2018-10-23 MED ORDER — POTASSIUM CHLORIDE 10 MEQ/100ML IV SOLN
10.0000 meq | INTRAVENOUS | Status: AC
  Filled 2018-10-23 (×2): qty 100

## 2018-10-23 MED ORDER — ENOXAPARIN SODIUM 40 MG/0.4ML ~~LOC~~ SOLN
40.0000 mg | SUBCUTANEOUS | Status: DC
  Administered 2018-10-23 – 2018-10-27 (×4): 40 mg via SUBCUTANEOUS
  Filled 2018-10-23 (×5): qty 0.4

## 2018-10-23 MED ORDER — PIPERACILLIN-TAZOBACTAM 3.375 G IVPB
3.3750 g | Freq: Three times a day (TID) | INTRAVENOUS | Status: DC
  Administered 2018-10-23 – 2018-10-24 (×2): 3.375 g via INTRAVENOUS
  Filled 2018-10-23 (×2): qty 50

## 2018-10-23 MED ORDER — INSULIN REGULAR(HUMAN) IN NACL 100-0.9 UT/100ML-% IV SOLN
INTRAVENOUS | Status: DC
  Filled 2018-10-23: qty 100

## 2018-10-23 MED ORDER — VANCOMYCIN HCL IN DEXTROSE 1-5 GM/200ML-% IV SOLN
1000.0000 mg | INTRAVENOUS | Status: DC
  Filled 2018-10-23: qty 200

## 2018-10-23 MED ORDER — LACTATED RINGERS IV BOLUS
1000.0000 mL | Freq: Once | INTRAVENOUS | Status: AC
  Administered 2018-10-23: 1000 mL via INTRAVENOUS

## 2018-10-23 MED ORDER — SODIUM CHLORIDE 0.9 % IV BOLUS
1000.0000 mL | Freq: Once | INTRAVENOUS | Status: AC
  Administered 2018-10-23: 1000 mL via INTRAVENOUS

## 2018-10-23 NOTE — Consult Note (Signed)
Pharmacy Antibiotic Note  Jenna Nelson is a 58 y.o. female admitted on 10/23/2018 with sepsis.  Pharmacy has been consulted for Vancomycin/Cefepime dosing.  Plan: Zosyn 3.375g IV q8h (4 hour infusion).   Will give Vancomycin 1500mg  IV loading dose, followed by  Vancomycin 1000 mg IV Q 24 hrs. Goal AUC 400-550. Expected AUC: 506 SCr used: 0.81  Height: 5\' 1"  (154.9 cm) Weight: 170 lb (77.1 kg) IBW/kg (Calculated) : 47.8  Temp (24hrs), Avg:98.2 F (36.8 C), Min:98.2 F (36.8 C), Max:98.2 F (36.8 C)  Recent Labs  Lab 10/23/18 1433  WBC 24.3*  CREATININE 0.81    Estimated Creatinine Clearance: 70.2 mL/min (by C-G formula based on SCr of 0.81 mg/dL).    Allergies  Allergen Reactions  . Codeine   . Sulfa Antibiotics     Antimicrobials this admission: Zosyn 10/18 >> Vancomycin 10/18 >>  Dose adjustments this admission: None  Microbiology results: 10/18 BCx: pending  Thank you for allowing pharmacy to be a part of this patient's care.  Lu Duffel, PharmD, BCPS Clinical Pharmacist 10/23/2018 6:02 PM ]

## 2018-10-23 NOTE — ED Notes (Signed)
Pt bg [phone]

## 2018-10-23 NOTE — ED Notes (Signed)
Patient transported to CT 

## 2018-10-23 NOTE — ED Notes (Signed)
bg 265

## 2018-10-23 NOTE — ED Triage Notes (Addendum)
From home by [facility] No meds for htn or diabetes meds for 1 week Blisters (?) in groin area, prescribed prednisone and fluctinase by PCP, taking those for past week instead Dizziness, abd pain, N/V x 3, thirst, excessive urination Sinus tach 120s BP 176/110 CBG 488 (475 then 552ml fluids then 488) Family states blood sugar was 375 about an hour before calling EMS 4mg  zofran IVP Family to follow  Pt states she felt like she was "about to fall out" earlier, still feels dizzy

## 2018-10-23 NOTE — ED Notes (Signed)
Pt states head is "spinning" even at rest.

## 2018-10-23 NOTE — ED Notes (Signed)
Attempted to call report to ICU, informed that nurses are still in report and they will call this RN back.

## 2018-10-23 NOTE — Consult Note (Addendum)
PULMONARY / CRITICAL CARE MEDICINE  Name: Jenna Nelson MRN: [id number] DOB: 10/09/1960    LOS: 1  Referring Provider:  Dr. Tobi BastosPyreddy Reason for Referral:  DKA and pyelonephritis Brief patient description:  58 y/o female admitted with DKA, pyelonephritis and sepsis  HPI:  This is a 58 y/o female with a medical history as indicated below who presented to the ED with complaints of nausea, vomiting and abdominal pain. ED workup significant for leukocytosis with a WBC of 24.3, glucose of 454 mg/dl,  Lactic acid of 9.8,JXB2.4,and  right-sided pyelonephritis on CT abdomen. She is being admitted to the ICU for further management  Past Medical History:  Diagnosis Date  . Diabetes mellitus without complication (HCC)   . Hypertension    Past Surgical History:  Procedure Laterality Date  . ABDOMINAL HYSTERECTOMY     No current facility-administered medications on file prior to encounter.    Current Outpatient Medications on File Prior to Encounter  Medication Sig  . ALPRAZolam (XANAX) 1 MG tablet Take 1 mg by mouth 3 (three) times daily as needed for anxiety.   Marland Kitchen. FLUoxetine (PROZAC) 40 MG capsule Take 40 mg by mouth daily.  Marland Kitchen. amLODIPine-Valsartan-HCTZ 10-320-25 MG TABS Take 1 tablet by mouth daily.  . metFORMIN (GLUCOPHAGE-XR) 500 MG 24 hr tablet Take 1 tablet by mouth 2 (two) times daily with a meal.  . omeprazole (PRILOSEC) 20 MG capsule Take 20 mg by mouth 2 (two) times daily.  . temazepam (RESTORIL) 30 MG capsule Take 1 capsule by mouth at bedtime as needed for sleep.     Allergies Allergies  Allergen Reactions  . Codeine   . Sulfa Antibiotics     Family History History reviewed. No pertinent family history. Social History  reports that she has never smoked. She has never used smokeless tobacco. She reports previous alcohol use. She reports previous drug use.  Review Of Systems:  Unable to obtain as patient is not responding to my questions even though she is clearly alert with eyes  closed with intact protective reflexes  VITAL SIGNS: BP (!) 153/89 (BP Location: Left Arm)   Pulse (!) 107   Temp 98.2 F (36.8 C) (Oral)   Resp (!) 23   Ht 5\' 1"  (1.549 m)   Wt 77.1 kg   SpO2 91%   BMI 32.12 kg/m   HEMODYNAMICS:    VENTILATOR SETTINGS:    INTAKE / OUTPUT: I/O last 3 completed shifts: In: 1000 [IV Piggyback:1000] Out: -   PHYSICAL EXAMINATION: General:  NAD HEENT: PERRLA, trachea midline, no JVD Neuro: Awake, moves all extremities, refusing to speak and keeping ice type close Cardiovascular: Apical pulse mildly tachycardic, S1-S2, no murmur regurg or gallop, +2 pulses bilaterally, no edema Lungs: Clear to auscultation bilaterally Abdomen: Nondistended, normal bowel sounds in all 4 quadrants, palpation reveals no organomegaly Musculoskeletal: Positive range of motion, no joint deformities Skin: Warm and dry  LABS:  BMET Recent Labs  Lab 10/23/18 1433 10/23/18 2021  NA 132* 135  K 4.2 4.1  CL 96* 106  CO2 11* 15*  BUN 17 13  CREATININE 0.81 0.71  0.77  GLUCOSE 454* 202*    Electrolytes Recent Labs  Lab 10/23/18 1433 10/23/18 2021  CALCIUM 8.5* 8.3*    CBC Recent Labs  Lab 10/23/18 1433 10/23/18 2021 10/24/18 0604  WBC 24.3* 20.3* 16.2*  HGB 14.6 13.8 13.4  HCT 43.3 41.5 40.6  PLT 300 249 190    Coag's No results for input(s): APTT,  INR in the last 168 hours.  Sepsis Markers Recent Labs  Lab 10/23/18 1721 10/23/18 1936 10/24/18 0504  LATICACIDVEN 2.4* 1.5 0.8    ABG No results for input(s): PHART, PCO2ART, PO2ART in the last 168 hours.  Liver Enzymes Recent Labs  Lab 10/23/18 1433  AST 40  ALT 34  ALKPHOS 89  BILITOT 2.4*  ALBUMIN 4.2    Cardiac Enzymes No results for input(s): TROPONINI, PROBNP in the last 168 hours.  Glucose Recent Labs  Lab 10/24/18 0022 10/24/18 0128 10/24/18 0222 10/24/18 0319 10/24/18 0403 10/24/18 0432  GLUCAP 168* 170* 152* 146* 163* 177*    Imaging Dg Chest 1  View  Result Date: 10/23/2018 CLINICAL DATA:  Leukocytosis EXAM: CHEST  1 VIEW COMPARISON:  04/17/2017 FINDINGS: Lungs are clear.  No pleural effusion or pneumothorax. The heart is top-normal in size. IMPRESSION: No evidence of acute cardiopulmonary disease. Electronically Signed   By: Julian Hy M.D.   On: 10/23/2018 15:55   Ct Head Wo Contrast  Result Date: 10/24/2018 CLINICAL DATA:  Speech difficulty EXAM: CT HEAD WITHOUT CONTRAST TECHNIQUE: Contiguous axial images were obtained from the base of the skull through the vertex without intravenous contrast. COMPARISON:  None. FINDINGS: Brain: No evidence of acute infarction, hemorrhage, hydrocephalus, extra-axial collection or mass lesion/mass effect. Vascular: No hyperdense vessel or unexpected calcification. Skull: Normal. Negative for fracture or focal lesion. Sinuses/Orbits: No acute finding. IMPRESSION: Negative head CT. Electronically Signed   By: Monte Fantasia M.D.   On: 10/24/2018 04:35   Ct Abdomen Pelvis W Contrast  Result Date: 10/23/2018 CLINICAL DATA:  58 year old female with abdominal pain and dizziness. EXAM: CT ABDOMEN AND PELVIS WITH CONTRAST TECHNIQUE: Multidetector CT imaging of the abdomen and pelvis was performed using the standard protocol following bolus administration of intravenous contrast. CONTRAST:  171mL OMNIPAQUE IOHEXOL 300 MG/ML  SOLN COMPARISON:  CT of the abdomen pelvis report dated 05/03/2007. The images are not available for direct comparison. FINDINGS: Lower chest: The visualized lung bases are clear. No intra-abdominal free air or free fluid. Hepatobiliary: There is diffuse fatty infiltration of the liver. No intrahepatic biliary ductal dilatation. Cholecystectomy. No retained calcified stone noted in the central CBD. Pancreas: Unremarkable. No pancreatic ductal dilatation or surrounding inflammatory changes. Spleen: Normal in size without focal abnormality. Adrenals/Urinary Tract: The adrenal glands are  unremarkable. There is no hydronephrosis on either side. Faint area of hypoenhancement in the upper pole of the right (series 2, image 27 and coronal series 5, image 66) as well as additional hypoenhancing areas in the lower pole and interpolar right kidney noted which are not well evaluated but may represent pyelonephritis. Correlation with urinalysis recommended. The visualized ureters and urinary bladder appear unremarkable. Stomach/Bowel: There is a small hiatal hernia. Minimal thickened appearance of the distal esophagus which may be related to underdistention or represent mild esophagitis. Clinical correlation is recommended. There is no bowel obstruction. The appendix is normal. Vascular/Lymphatic: The abdominal aorta and IVC are unremarkable. No portal venous gas. There is no adenopathy. Reproductive: Hysterectomy. No adnexal masses. Other: None Musculoskeletal: No acute or significant osseous findings. IMPRESSION: 1. Findings concerning for right-sided pyelonephritis. Correlation with urinalysis recommended. 2. No bowel obstruction or active inflammation. Normal appendix. 3. Fatty liver. 4. Small hiatal hernia. Underdistention versus mild distal esophagitis. Clinical correlation is recommended. Electronically Signed   By: Anner Crete M.D.   On: 10/23/2018 16:58     STUDIES:  None  CULTURES: Blood cultures x2  ANTIBIOTICS: Zosyn pyelonephritis  SIGNIFICANT EVENTS: 10/23/2018: Admitted  LINES/TUBES: Peripheral IV  DISCUSSION: 58 year old female presenting with presenting with sepsis secondary to pyelonephritis and DKA.  She speaks when she wants to and when we attempt to communicate with her she is mute however her defensive reflexes are intact indicating that she is fully awake and conscious.  Stat CT head negative.  Discussed concern for conversion disorder.  ASSESSMENT  DKA Pyelonephritis Sepsis secondary to pyelonephritis Hypertension associated with type 2 diabetes  expressive aphasia-likely due to conversion disorder as CT head is normal and protective reflexes are intact and patient is fully awake.  PLAN DKA protocol Antibiotics as above Follow-up cultures IV hydration Resume Norvasc at 10 mg daily hold hydrochlorothiazide in light of DKA As needed hydralazine and metoprolol for SBP greater than 150 after oral meds Consider psych consult if patient continues to be mute and refusing to participate in care  Best Practice: Code Status: Full code Diet: Carb modified GI prophylaxis: Not indicated VTE prophylaxis: SCDs and subcu Lovenox  FAMILY  - Updates: Attempted to call patient's brother but got his voicemail  Avarae Zwart S. Tukov-Yual ANP-BC Pulmonary and Critical Care Medicine [facility] Pager [phone] or [phone]  NB: This document was prepared using Dragon voice recognition software and may include unintentional dictation errors.    10/24/2018, 6:36 AM

## 2018-10-23 NOTE — H&P (Addendum)
[facility] [place] NAME: Jenna Nelson    MR#:  [id number]  DATE OF BIRTH:  1960-05-10  DATE OF ADMISSION:  10/23/2018  PRIMARY CARE PHYSICIAN: Crist Infante, MD   REQUESTING/REFERRING PHYSICIAN:   CHIEF COMPLAINT:   Chief Complaint  Patient presents with  . Near Syncope    HISTORY OF PRESENT ILLNESS: Jenna Nelson  is a 58 y.o. female with a known history of hypertension, type 2 diabetes mellitus presented to the emergency room for nausea vomiting and abdominal discomfort.  Patient also has been dizzy for the last couple of days.  Patient did not take diabetic medication for 1 week. She was having polyuria polyphagia and excessive thirst.  She has been tachycardic in the emergency room.  Initially her blood sugars were high bicarb was low and anion gap was elevated.  Patient was in DKA and started on IV insulin drip.  Her WBC count is elevated and patient appeared septic.  Patient also complains of right-sided flank pain aching in nature 6 out of 10 on a scale of 1-10.  She was worked up with CT abdomen which showed right-sided pyelonephritis.  Hospitalist service was consulted for further care.  PAST MEDICAL HISTORY:   Past Medical History:  Diagnosis Date  . Diabetes mellitus without complication ([place])   . Hypertension     PAST SURGICAL HISTORY:  Past Surgical History:  Procedure Laterality Date  . ABDOMINAL HYSTERECTOMY      SOCIAL HISTORY:  Social History   Tobacco Use  . Smoking status: Never Smoker  . Smokeless tobacco: Never Used  Substance Use Topics  . Alcohol use: Not Currently    FAMILY HISTORY: History reviewed. No pertinent family history.  DRUG ALLERGIES:  Allergies  Allergen Reactions  . Codeine   . Sulfa Antibiotics     REVIEW OF SYSTEMS:   CONSTITUTIONAL: Low grade fever, has fatigue and weakness.  EYES: No blurred or double vision.  EARS, NOSE, AND THROAT: No tinnitus or ear pain.   RESPIRATORY: No cough, shortness of breath, wheezing or hemoptysis.  CARDIOVASCULAR: No chest pain, orthopnea, edema.  GASTROINTESTINAL: No nausea, vomiting, diarrhea or abdominal pain.  Has flank pain GENITOURINARY: mild dysuria, no hematuria.  ENDOCRINE: No polyuria, nocturia,  HEMATOLOGY: No anemia, easy bruising or bleeding SKIN: No rash or lesion. MUSCULOSKELETAL: No joint pain or arthritis.   NEUROLOGIC: No tingling, numbness, weakness.  PSYCHIATRY: No anxiety or depression.   MEDICATIONS AT HOME:  Prior to Admission medications   Medication Sig Start Date End Date Taking? Authorizing Provider  ALPRAZolam Duanne Moron) 1 MG tablet Take 1 mg by mouth 3 (three) times daily as needed for anxiety.  03/12/17   [provider]  amLODIPine-Valsartan-HCTZ 82-505-39 MG TABS Take 1 tablet by mouth daily. 01/12/17   [provider]  FLUoxetine (PROZAC) 40 MG capsule Take 40 mg by mouth daily. 02/04/17   [provider]  metFORMIN (GLUCOPHAGE-XR) 500 MG 24 hr tablet Take 1 tablet by mouth 2 (two) times daily with a meal. 03/10/17   [provider]  omeprazole (PRILOSEC) 20 MG capsule Take 20 mg by mouth 2 (two) times daily. 01/17/17   [provider]  temazepam (RESTORIL) 30 MG capsule Take 1 capsule by mouth at bedtime as needed for sleep.  03/15/17   [provider]      PHYSICAL EXAMINATION:   VITAL SIGNS: Blood pressure (!) 168/90, pulse (!) 113, temperature 98.2 F (36.8 C), temperature  source Oral, resp. rate (!) 23, height 5\' 1"  (1.549 m), weight 77.1 kg, SpO2 97 %.  GENERAL:  58 y.o.-year-old patient lying in the bed with no acute distress.  EYES: Pupils equal, round, reactive to light and accommodation. No scleral icterus. Extraocular muscles intact.  HEENT: Head atraumatic, normocephalic. Oropharynx dry and nasopharynx clear.  NECK:  Supple, no jugular venous distention. No thyroid enlargement, no tenderness.  LUNGS: Normal breath sounds  bilaterally, no wheezing, rales,rhonchi or crepitation. No use of accessory muscles of respiration.  CARDIOVASCULAR: S1, S2 tachycardia noted. No murmurs, rubs, or gallops.  ABDOMEN: Soft, tenderness right CVA angle, nondistended. Bowel sounds present. No organomegaly or mass.  EXTREMITIES: No pedal edema, cyanosis, or clubbing.  NEUROLOGIC: Cranial nerves II through XII are intact. Muscle strength 5/5 in all extremities. Sensation intact. Gait not checked.  PSYCHIATRIC: The patient is alert and oriented x 3.  SKIN: No obvious rash, lesion, or ulcer.   LABORATORY PANEL:   CBC Recent Labs  Lab 10/23/18 1433  WBC 24.3*  HGB 14.6  HCT 43.3  PLT 300  MCV 89.1  MCH 30.0  MCHC 33.7  RDW 13.4   ------------------------------------------------------------------------------------------------------------------  Chemistries  Recent Labs  Lab 10/23/18 1433  NA 132*  K 4.2  CL 96*  CO2 11*  GLUCOSE 454*  BUN 17  CREATININE 0.81  CALCIUM 8.5*  AST 40  ALT 34  ALKPHOS 89  BILITOT 2.4*   ------------------------------------------------------------------------------------------------------------------ estimated creatinine clearance is 70.2 mL/min (by C-G formula based on SCr of 0.81 mg/dL). ------------------------------------------------------------------------------------------------------------------ No results for input(s): TSH, T4TOTAL, T3FREE, THYROIDAB in the last 72 hours.  Invalid input(s): FREET3   Coagulation profile No results for input(s): INR, PROTIME in the last 168 hours. ------------------------------------------------------------------------------------------------------------------- No results for input(s): DDIMER in the last 72 hours. -------------------------------------------------------------------------------------------------------------------  Cardiac Enzymes No results for input(s): CKMB, TROPONINI, MYOGLOBIN in the last 168 hours.  Invalid  input(s): CK ------------------------------------------------------------------------------------------------------------------ Invalid input(s): POCBNP  ---------------------------------------------------------------------------------------------------------------  Urinalysis    Component Value Date/Time   COLORURINE STRAW (A) 10/23/2018 1433   APPEARANCEUR CLEAR (A) 10/23/2018 1433   LABSPEC 1.026 10/23/2018 1433   PHURINE 5.0 10/23/2018 1433   GLUCOSEU >=500 (A) 10/23/2018 1433   HGBUR MODERATE (A) 10/23/2018 1433   BILIRUBINUR NEGATIVE 10/23/2018 1433   KETONESUR 80 (A) 10/23/2018 1433   PROTEINUR NEGATIVE 10/23/2018 1433   UROBILINOGEN 0.2 05/02/2007 1937   NITRITE NEGATIVE 10/23/2018 1433   LEUKOCYTESUR NEGATIVE 10/23/2018 1433     RADIOLOGY: Dg Chest 1 View  Result Date: 10/23/2018 CLINICAL DATA:  Leukocytosis EXAM: CHEST  1 VIEW COMPARISON:  04/17/2017 FINDINGS: Lungs are clear.  No pleural effusion or pneumothorax. The heart is top-normal in size. IMPRESSION: No evidence of acute cardiopulmonary disease. Electronically Signed   By: Charline BillsSriyesh  Krishnan M.D.   On: 10/23/2018 15:55   Ct Abdomen Pelvis W Contrast  Result Date: 10/23/2018 CLINICAL DATA:  58 year old female with abdominal pain and dizziness. EXAM: CT ABDOMEN AND PELVIS WITH CONTRAST TECHNIQUE: Multidetector CT imaging of the abdomen and pelvis was performed using the standard protocol following bolus administration of intravenous contrast. CONTRAST:  100mL OMNIPAQUE IOHEXOL 300 MG/ML  SOLN COMPARISON:  CT of the abdomen pelvis report dated 05/03/2007. The images are not available for direct comparison. FINDINGS: Lower chest: The visualized lung bases are clear. No intra-abdominal free air or free fluid. Hepatobiliary: There is diffuse fatty infiltration of the liver. No intrahepatic biliary ductal dilatation. Cholecystectomy. No retained calcified stone noted in the central CBD. Pancreas: Unremarkable.  No pancreatic  ductal dilatation or surrounding inflammatory changes. Spleen: Normal in size without focal abnormality. Adrenals/Urinary Tract: The adrenal glands are unremarkable. There is no hydronephrosis on either side. Faint area of hypoenhancement in the upper pole of the right (series 2, image 27 and coronal series 5, image 66) as well as additional hypoenhancing areas in the lower pole and interpolar right kidney noted which are not well evaluated but may represent pyelonephritis. Correlation with urinalysis recommended. The visualized ureters and urinary bladder appear unremarkable. Stomach/Bowel: There is a small hiatal hernia. Minimal thickened appearance of the distal esophagus which may be related to underdistention or represent mild esophagitis. Clinical correlation is recommended. There is no bowel obstruction. The appendix is normal. Vascular/Lymphatic: The abdominal aorta and IVC are unremarkable. No portal venous gas. There is no adenopathy. Reproductive: Hysterectomy. No adnexal masses. Other: None Musculoskeletal: No acute or significant osseous findings. IMPRESSION: 1. Findings concerning for right-sided pyelonephritis. Correlation with urinalysis recommended. 2. No bowel obstruction or active inflammation. Normal appendix. 3. Fatty liver. 4. Small hiatal hernia. Underdistention versus mild distal esophagitis. Clinical correlation is recommended. Electronically Signed   By: Elgie Collard M.D.   On: 10/23/2018 16:58    EKG: Orders placed or performed during the hospital encounter of 10/23/18  . ED EKG  . ED EKG  . EKG 12-Lead  . EKG 12-Lead    IMPRESSION AND PLAN: 58 year old female patient with a known history of hypertension, type 2 diabetes mellitus presented to the emergency room for nausea vomiting and abdominal discomfort.  Patient also has been dizzy for the last couple of days.  She was having polyuria polyphagia and excessive thirst.  -Diabetic ketoacidosis Admit patient to stepdown  unit IV insulin drip N.p.o. Intensivist consult Monitor blood sugars every hourly  -Sepsis secondary to pyelonephritis .Patient on broad-spectrum antibiotics Vanco and Zosyn Follow-up cultures and taper antibiotics  -Dehydration IV fluids  -DVT prophylaxis subcu Lovenox daily  All the records are reviewed and case discussed with ED provider. Management plans discussed with the patient, family and they are in agreement.  CODE STATUS: Full code  TOTAL CRITICAL CARE TIME TAKING CARE OF THIS PATIENT: 55 minutes.    Ihor Austin M.D on 10/23/2018 at 5:49 PM  Between 7am to 6pm - Pager - [phone]  After 6pm go to [email or web address] - password [email or web address] [facility]  [facility] [place] [facility]  Office  [phone]  CC: Primary care physician; Rodrigo Ran, MD

## 2018-10-23 NOTE — ED Provider Notes (Signed)
[facility] Emergency Department Provider Note  ____________________________________________   First MD Initiated Contact with Patient 10/23/18 1452     (approximate)  I have reviewed the triage vital signs and the nursing notes.   HISTORY  Chief Complaint Near Syncope    HPI Jenna Nelson is a 58 y.o. female with diabetes who presents with near syncope.  Patient endorses just feeling dizzy and not like her normal self.  She was recently started on prednisone on Monday.  She is taken 3 to 4 days of this.  About 2 days after starting the prednisone is when she started to feel her symptoms.  She has had generalized weakness and increased thirst.  She is supposed be taking 60 units of insulin the morning but missed her dose today due to just not feeling well.  She says she is not been keeping track of her sugars.  She was taking the steroids after her OB/GYN prescribed it for some vaginal ulcers.  These have not cleared up and she declined me to examine the area.  She has had diffuse abdominal pain that is moderate, constant, nothing makes better, nothing makes it worse.  Denies any urinary symptoms, cough, chest pain, shortness of breath.  Denies any fevers.  No other skin sources.  Patient's brother then reported the patient has not been taking her medicines for over a week.       Past Medical History:  Diagnosis Date   Diabetes mellitus without complication (HCC)    Hypertension     There are no active problems to display for this patient.   Past Surgical History:  Procedure Laterality Date   ABDOMINAL HYSTERECTOMY      Prior to Admission medications   Medication Sig Start Date End Date Taking? Authorizing Provider  ALPRAZolam Prudy Feeler) 1 MG tablet Take 1 mg by mouth 3 (three) times daily as needed for anxiety.  03/12/17   [provider]  amLODIPine-Valsartan-HCTZ 38-466-59 MG TABS Take 1 tablet by mouth daily. 01/12/17   [provider]  FLUoxetine (PROZAC) 40 MG capsule Take 40 mg by mouth daily. 02/04/17   [provider]  metFORMIN (GLUCOPHAGE-XR) 500 MG 24 hr tablet Take 1 tablet by mouth 2 (two) times daily with a meal. 03/10/17   [provider]  omeprazole (PRILOSEC) 20 MG capsule Take 20 mg by mouth 2 (two) times daily. 01/17/17   [provider]  temazepam (RESTORIL) 30 MG capsule Take 1 capsule by mouth at bedtime as needed for sleep.  03/15/17   [provider]    Allergies Codeine and Sulfa antibiotics  History reviewed. No pertinent family history.  Social History Social History   Tobacco Use   Smoking status: Never Smoker   Smokeless tobacco: Never Used  Substance Use Topics   Alcohol use: Not Currently   Drug use: Not Currently      Review of Systems Constitutional: No fever/chills, fatigue, weakness, dizziness Eyes: No visual changes. ENT: No sore throat. Cardiovascular: Denies chest pain. Respiratory: Denies shortness of breath. Gastrointestinal: Abdominal pain and vomiting Genitourinary: Negative for dysuria. Musculoskeletal: Negative for back pain. Skin: Negative for rash. Neurological: Negative for headaches, focal weakness or numbness. All other ROS negative ____________________________________________   PHYSICAL EXAM:  VITAL SIGNS: ED Triage Vitals  Enc Vitals Group     BP 10/23/18 1354 (!) 173/81     Pulse Rate 10/23/18 1352 (!) 116     Resp 10/23/18 1400 (!) 24  Temp 10/23/18 1352 98.2 F (36.8 C)     Temp Source 10/23/18 1352 Oral     SpO2 10/23/18 1348 96 %     Weight 10/23/18 1355 170 lb (77.1 kg)     Height 10/23/18 1355 5\' 1"  (1.549 m)     Head Circumference --      Peak Flow --      Pain Score 10/23/18 1353 9     Pain Loc --      Pain Edu? --      Excl. in [place]? --     Constitutional: Alert and oriented. Well appearing and in no acute distress. Eyes: Conjunctivae are normal. EOMI. Head: Atraumatic. Nose:  No congestion/rhinnorhea. Mouth/Throat: Mucous membranes are moist.   Neck: No stridor. Trachea Midline. FROM Cardiovascular: Tachycardic, regular rhythm. Grossly normal heart sounds.  Good peripheral circulation. Respiratory: Normal respiratory effort.  No retractions. Lungs CTAB. Gastrointestinal: Soft with diffuse tenderness.  No distention. No abdominal bruits.  Musculoskeletal: No lower extremity tenderness nor edema.  No joint effusions. Neurologic:  Normal speech and language. No gross focal neurologic deficits are appreciated.  Skin:  Skin is warm, dry and intact. No rash noted. Psychiatric: Mood and affect are normal. Speech and behavior are normal. GU: Deferred   ____________________________________________   LABS (all labs ordered are listed, but only abnormal results are displayed)  Labs Reviewed  BASIC METABOLIC PANEL - Abnormal; Notable for the following components:      Result Value   Sodium 132 (*)    Chloride 96 (*)    CO2 11 (*)    Glucose, Bld 454 (*)    Calcium 8.5 (*)    Anion gap 25 (*)    All other components within normal limits  CBC - Abnormal; Notable for the following components:   WBC 24.3 (*)    All other components within normal limits  URINALYSIS, COMPLETE (UACMP) WITH MICROSCOPIC - Abnormal; Notable for the following components:   Color, Urine STRAW (*)    APPearance CLEAR (*)    Glucose, UA >=500 (*)    Hgb urine dipstick MODERATE (*)    Ketones, ur 80 (*)    All other components within normal limits  GLUCOSE, CAPILLARY - Abnormal; Notable for the following components:   Glucose-Capillary 462 (*)    All other components within normal limits  HEPATIC FUNCTION PANEL - Abnormal; Notable for the following components:   Total Bilirubin 2.4 (*)    Bilirubin, Direct 0.3 (*)    Indirect Bilirubin 2.1 (*)    All other components within normal limits  LIPASE, BLOOD  BETA-HYDROXYBUTYRIC ACID  CBG MONITORING, ED    ____________________________________________   ED ECG REPORT I, Vanessa [place], the attending physician, personally viewed and interpreted this ECG.  Sinus tachycardia rate of 131, no ST elevation, no T wave inversion, QTC slightly prolonged at 498 ____________________________________________  RADIOLOGY I, Vanessa [place], personally viewed and evaluated these images (plain radiographs) as part of my medical decision making, as well as reviewing the written report by the radiologist.  ED MD interpretation: No pneumonia  Official radiology report(s): Dg Chest 1 View  Result Date: 10/23/2018 CLINICAL DATA:  Leukocytosis EXAM: CHEST  1 VIEW COMPARISON:  04/17/2017 FINDINGS: Lungs are clear.  No pleural effusion or pneumothorax. The heart is top-normal in size. IMPRESSION: No evidence of acute cardiopulmonary disease. Electronically Signed   By: Julian Hy M.D.   On: 10/23/2018 15:55   Ct Abdomen Pelvis W Contrast  Result Date: 10/23/2018 CLINICAL DATA:  58 year old female with abdominal pain and dizziness. EXAM: CT ABDOMEN AND PELVIS WITH CONTRAST TECHNIQUE: Multidetector CT imaging of the abdomen and pelvis was performed using the standard protocol following bolus administration of intravenous contrast. CONTRAST:  OMNIPAQUE IOHEXOL 300 MG/ML  SOLN COMPARISON:  CT of the abdomen pelvis report dated 05/03/2007. The images are not available for direct comparison. FINDINGS: Lower chest: The visualized lung bases are clear. No intra-abdominal free air or free fluid. Hepatobiliary: There is diffuse fatty infiltration of the liver. No intrahepatic biliary ductal dilatation. Cholecystectomy. No retained calcified stone noted in the central CBD. Pancreas: Unremarkable. No pancreatic ductal dilatation or surrounding inflammatory changes. Spleen: Normal in size without focal abnormality. Adrenals/Urinary Tract: The adrenal glands are unremarkable. There is no hydronephrosis on either side.  Faint area of hypoenhancement in the upper pole of the right (series 2, image 27 and coronal series 5, image 66) as well as additional hypoenhancing areas in the lower pole and interpolar right kidney noted which are not well evaluated but may represent pyelonephritis. Correlation with urinalysis recommended. The visualized ureters and urinary bladder appear unremarkable. Stomach/Bowel: There is a small hiatal hernia. Minimal thickened appearance of the distal esophagus which may be related to underdistention or represent mild esophagitis. Clinical correlation is recommended. There is no bowel obstruction. The appendix is normal. Vascular/Lymphatic: The abdominal aorta and IVC are unremarkable. No portal venous gas. There is no adenopathy. Reproductive: Hysterectomy. No adnexal masses. Other: None Musculoskeletal: No acute or significant osseous findings. IMPRESSION: 1. Findings concerning for right-sided pyelonephritis. Correlation with urinalysis recommended. 2. No bowel obstruction or active inflammation. Normal appendix. 3. Fatty liver. 4. Small hiatal hernia. Underdistention versus mild distal esophagitis. Clinical correlation is recommended. Electronically Signed   By: Elgie Collard M.D.   On: 10/23/2018 16:58    ____________________________________________   PROCEDURES  Procedure(s) performed (including Critical Care):  .Critical Care Performed by: Concha Se, MD Authorized by: Concha Se, MD   Critical care provider statement:    Critical care time (minutes):  45   Critical care was necessary to treat or prevent imminent or life-threatening deterioration of the following conditions:  Sepsis and endocrine crisis   Critical care was time spent personally by me on the following activities:  Discussions with consultants, evaluation of patient's response to treatment, examination of patient, ordering and performing treatments and interventions, ordering and review of laboratory studies,  ordering and review of radiographic studies, pulse oximetry, re-evaluation of patient's condition, obtaining history from patient or surrogate and review of old charts     ____________________________________________   INITIAL IMPRESSION / ASSESSMENT AND PLAN / ED COURSE  MAGDELINE PRANGE was evaluated in Emergency Department on 10/23/2018 for the symptoms described in the history of present illness. She was evaluated in the context of the global COVID-19 pandemic, which necessitated consideration that the patient might be at risk for infection with the SARS-CoV-2 virus that causes COVID-19. Institutional protocols and algorithms that pertain to the evaluation of patients at risk for COVID-19 are in a state of rapid change based on information released by regulatory bodies including the CDC and federal and state organizations. These policies and algorithms were followed during the patient's care in the ED.    Patient is a 58 year old who presents with tachycardia, increased thirst and sugars in the 400s.  Patient's story is most concerning for DKA.  Will get labs to evaluate for electrolyte abnormalities, AKI, DKA.  Patient noted  to have a white count of 24.  Will add on chest x-ray to evaluate for pneumonia.  Given patient's abdominal tenderness will get CT scan to evaluate for intra-abdominal infection.  Some of this could be secondary to the prednisone.  Patient does meet sirs criteria with the tachycardia and elevated white count.  Will get blood cultures, lactate and start broad-spectrum antibiotics to cover for bacteremia.   UA no signs of UTI but patient does have 80 ketones in the urine.  Labs are consistent with DKA.  Will start patient on insulin.  CT scan concerning for [place] but urine was without evidence of UTI.  Patient receiving broad-spectrum antibiotics for SIRS criteria.  Patient will need to be admitted to the ICU. ___________________________________________   FINAL CLINICAL  IMPRESSION(S) / ED DIAGNOSES   Final diagnoses:  Diabetic ketoacidosis without coma associated with type 2 diabetes mellitus (HCC)  SIRS (systemic inflammatory response syndrome) (HCC)      MEDICATIONS GIVEN DURING THIS VISIT:  Medications  insulin regular, human (MYXREDLIN) 100 units/ 100 mL infusion (has no administration in time range)  potassium chloride 10 mEq in 100 mL IVPB (has no administration in time range)  piperacillin-tazobactam (ZOSYN) IVPB 3.375 g (has no administration in time range)  lactated ringers bolus 1,000 mL (has no administration in time range)  sodium chloride 0.9 % bolus 1,000 mL (0 mLs Intravenous Stopped 10/23/18 1551)  iohexol (OMNIPAQUE) 300 MG/ML solution 100 mL (100 mLs Intravenous Contrast Given 10/23/18 1627)     ED Discharge Orders    None       Note:  This document was prepared using Dragon voice recognition software and may include unintentional dictation errors.   Concha SeFunke, Cheyanne Lamison E, MD 10/23/18 [phone]

## 2018-10-23 NOTE — ED Notes (Signed)
Pt provided with warm blanket at this time. Pt's family remains at bedside. Will continue to monitor for further patient needs.

## 2018-10-24 ENCOUNTER — Other Ambulatory Visit: Payer: Federal, State, Local not specified - PPO

## 2018-10-24 ENCOUNTER — Inpatient Hospital Stay: Payer: Federal, State, Local not specified - PPO

## 2018-10-24 DIAGNOSIS — E1159 Type 2 diabetes mellitus with other circulatory complications: Secondary | ICD-10-CM

## 2018-10-24 DIAGNOSIS — E1110 Type 2 diabetes mellitus with ketoacidosis without coma: Secondary | ICD-10-CM | POA: Diagnosis not present

## 2018-10-24 DIAGNOSIS — A419 Sepsis, unspecified organism: Secondary | ICD-10-CM | POA: Diagnosis not present

## 2018-10-24 DIAGNOSIS — E860 Dehydration: Secondary | ICD-10-CM | POA: Diagnosis not present

## 2018-10-24 DIAGNOSIS — R4789 Other speech disturbances: Secondary | ICD-10-CM | POA: Diagnosis not present

## 2018-10-24 DIAGNOSIS — R569 Unspecified convulsions: Secondary | ICD-10-CM | POA: Diagnosis not present

## 2018-10-24 DIAGNOSIS — I152 Hypertension secondary to endocrine disorders: Secondary | ICD-10-CM

## 2018-10-24 DIAGNOSIS — N12 Tubulo-interstitial nephritis, not specified as acute or chronic: Secondary | ICD-10-CM | POA: Diagnosis not present

## 2018-10-24 LAB — CBC
HCT: 40.6 % (ref 36.0–46.0)
Hemoglobin: 13.4 g/dL (ref 12.0–15.0)
MCH: 29.5 pg (ref 26.0–34.0)
MCHC: 33 g/dL (ref 30.0–36.0)
MCV: 89.4 fL (ref 80.0–100.0)
Platelets: 190 10*3/uL (ref 150–400)
RBC: 4.54 MIL/uL (ref 3.87–5.11)
RDW: 13.5 % (ref 11.5–15.5)
WBC: 16.2 10*3/uL — ABNORMAL HIGH (ref 4.0–10.5)
nRBC: 0 % (ref 0.0–0.2)

## 2018-10-24 LAB — MAGNESIUM: Magnesium: 2.1 mg/dL (ref 1.7–2.4)

## 2018-10-24 LAB — GLUCOSE, CAPILLARY
Glucose-Capillary: 123 mg/dL — ABNORMAL HIGH (ref 70–99)
Glucose-Capillary: 128 mg/dL — ABNORMAL HIGH (ref 70–99)
Glucose-Capillary: 146 mg/dL — ABNORMAL HIGH (ref 70–99)
Glucose-Capillary: 152 mg/dL — ABNORMAL HIGH (ref 70–99)
Glucose-Capillary: 163 mg/dL — ABNORMAL HIGH (ref 70–99)
Glucose-Capillary: 166 mg/dL — ABNORMAL HIGH (ref 70–99)
Glucose-Capillary: 168 mg/dL — ABNORMAL HIGH (ref 70–99)
Glucose-Capillary: 170 mg/dL — ABNORMAL HIGH (ref 70–99)
Glucose-Capillary: 177 mg/dL — ABNORMAL HIGH (ref 70–99)
Glucose-Capillary: 177 mg/dL — ABNORMAL HIGH (ref 70–99)
Glucose-Capillary: 188 mg/dL — ABNORMAL HIGH (ref 70–99)
Glucose-Capillary: 202 mg/dL — ABNORMAL HIGH (ref 70–99)

## 2018-10-24 LAB — BASIC METABOLIC PANEL
Anion gap: 11 (ref 5–15)
BUN: 9 mg/dL (ref 6–20)
CO2: 15 mmol/L — ABNORMAL LOW (ref 22–32)
Calcium: 7.9 mg/dL — ABNORMAL LOW (ref 8.9–10.3)
Chloride: 111 mmol/L (ref 98–111)
Creatinine, Ser: 0.66 mg/dL (ref 0.44–1.00)
GFR calc Af Amer: >60 mL/min (ref >60)
GFR calc non Af Amer: >60 mL/min (ref >60)
Glucose, Bld: 212 mg/dL — ABNORMAL HIGH (ref 70–99)
Potassium: 3.8 mmol/L (ref 3.5–5.1)
Sodium: 137 mmol/L (ref 135–145)

## 2018-10-24 LAB — PHOSPHORUS: Phosphorus: 1.8 mg/dL — ABNORMAL LOW (ref 2.5–4.6)

## 2018-10-24 LAB — HIV ANTIBODY (ROUTINE TESTING W REFLEX): HIV Screen 4th Generation wRfx: NONREACTIVE

## 2018-10-24 LAB — LACTIC ACID, PLASMA: Lactic Acid, Venous: 0.8 mmol/L (ref 0.5–1.9)

## 2018-10-24 MED ORDER — AMLODIPINE BESYLATE 10 MG PO TABS
10.0000 mg | ORAL_TABLET | Freq: Every day | ORAL | Status: DC
  Administered 2018-10-24 – 2018-10-26 (×2): 10 mg via ORAL
  Filled 2018-10-24 (×3): qty 1

## 2018-10-24 MED ORDER — GADOBUTROL 1 MMOL/ML IV SOLN
7.0000 mL | Freq: Once | INTRAVENOUS | Status: AC | PRN
  Administered 2018-10-24: 7 mL via INTRAVENOUS

## 2018-10-24 MED ORDER — SODIUM CHLORIDE 0.9 % IV SOLN
1.0000 g | INTRAVENOUS | Status: DC
  Administered 2018-10-24 – 2018-10-26 (×3): 1 g via INTRAVENOUS
  Filled 2018-10-24: qty 10
  Filled 2018-10-24 (×2): qty 1
  Filled 2018-10-24: qty 10
  Filled 2018-10-24: qty 1
  Filled 2018-10-24: qty 10

## 2018-10-24 MED ORDER — HYDRALAZINE HCL 10 MG PO TABS
10.0000 mg | ORAL_TABLET | Freq: Four times a day (QID) | ORAL | Status: DC | PRN
  Filled 2018-10-24: qty 1

## 2018-10-24 MED ORDER — INSULIN REGULAR(HUMAN) IN NACL 100-0.9 UT/100ML-% IV SOLN: INTRAVENOUS | Status: DC

## 2018-10-24 MED ORDER — LORAZEPAM 2 MG/ML IJ SOLN
2.0000 mg | Freq: Once | INTRAMUSCULAR | Status: AC
  Administered 2018-10-24: 10:00:00 2 mg via INTRAVENOUS

## 2018-10-24 MED ORDER — INSULIN GLARGINE 100 UNIT/ML ~~LOC~~ SOLN
10.0000 [IU] | Freq: Every day | SUBCUTANEOUS | Status: DC
  Administered 2018-10-24 – 2018-10-25 (×2): 10 [IU] via SUBCUTANEOUS
  Filled 2018-10-24 (×3): qty 0.1

## 2018-10-24 MED ORDER — INSULIN GLARGINE 100 UNIT/ML ~~LOC~~ SOLN
10.0000 [IU] | Freq: Every day | SUBCUTANEOUS | Status: DC
  Administered 2018-10-24: 10 [IU] via SUBCUTANEOUS
  Filled 2018-10-24: qty 0.1

## 2018-10-24 MED ORDER — LEVETIRACETAM IN NACL 500 MG/100ML IV SOLN
500.0000 mg | Freq: Two times a day (BID) | INTRAVENOUS | Status: DC
  Administered 2018-10-24: 500 mg via INTRAVENOUS
  Filled 2018-10-24 (×4): qty 100

## 2018-10-24 MED ORDER — LORAZEPAM 2 MG/ML IJ SOLN
INTRAMUSCULAR | Status: AC
  Administered 2018-10-24: 2 mg via INTRAVENOUS
  Filled 2018-10-24: qty 1

## 2018-10-24 MED ORDER — INSULIN ASPART 100 UNIT/ML ~~LOC~~ SOLN
0.0000 [IU] | SUBCUTANEOUS | Status: DC
  Administered 2018-10-24 (×3): 3 [IU] via SUBCUTANEOUS
  Administered 2018-10-24 (×2): 2 [IU] via SUBCUTANEOUS
  Administered 2018-10-25: 3 [IU] via SUBCUTANEOUS
  Administered 2018-10-25 – 2018-10-27 (×4): 2 [IU] via SUBCUTANEOUS
  Administered 2018-10-27: 5 [IU] via SUBCUTANEOUS
  Administered 2018-10-27 – 2018-10-28 (×2): 3 [IU] via SUBCUTANEOUS
  Administered 2018-10-28: 2 [IU] via SUBCUTANEOUS
  Administered 2018-10-28: 18:00:00 4 [IU] via SUBCUTANEOUS
  Filled 2018-10-24 (×15): qty 1

## 2018-10-24 MED ORDER — LORAZEPAM 2 MG/ML IJ SOLN: 1.0000 mg | Freq: Once | INTRAMUSCULAR | Status: DC

## 2018-10-24 MED ORDER — METOPROLOL TARTRATE 5 MG/5ML IV SOLN
2.5000 mg | INTRAVENOUS | Status: DC | PRN
  Administered 2018-10-26: 16:00:00 5 mg via INTRAVENOUS
  Filled 2018-10-24: qty 5

## 2018-10-24 MED ORDER — LEVETIRACETAM IN NACL 1000 MG/100ML IV SOLN
1000.0000 mg | Freq: Once | INTRAVENOUS | Status: AC
  Administered 2018-10-24: 1000 mg via INTRAVENOUS
  Filled 2018-10-24 (×2): qty 100

## 2018-10-24 MED ORDER — POTASSIUM PHOSPHATES 15 MMOLE/5ML IV SOLN
20.0000 mmol | Freq: Once | INTRAVENOUS | Status: AC
  Administered 2018-10-24: 20 mmol via INTRAVENOUS
  Filled 2018-10-24 (×2): qty 6.67

## 2018-10-24 MED ORDER — METOPROLOL TARTRATE 5 MG/5ML IV SOLN: 5.0000 mg | INTRAVENOUS | Status: DC | PRN

## 2018-10-24 NOTE — Progress Notes (Signed)
[place] [facility] [place] NAME: Rodolfo Notaro    MR#:  [id number]  DATE OF BIRTH:  09/15/1960  SUBJECTIVE:   Patient seen in the ICU. She looks at me upon calling her name however rolls her eyes and tracks towards the right. Received initially Ativan for possible seizure. Seen by neurology.  on insulin drip REVIEW OF SYSTEMS:   Review of Systems  Unable to perform ROS: Mental status change   Tolerating Diet: Tolerating PT:   DRUG ALLERGIES:   Allergies  Allergen Reactions  . Codeine   . Sulfa Antibiotics     VITALS:  Blood pressure (!) 144/88, pulse (!) 118, temperature 98.9 F (37.2 C), temperature source Axillary, resp. rate (!) 27, height 5\' 1"  (1.549 m), weight 77.1 kg, SpO2 92 %.  PHYSICAL EXAMINATION:   Physical Exam  Limited exam due to MS change GENERAL:  58 y.o.-year-old patient lying in the bed with no acute distress.  EYES: eyeballs rolled towards the right. HEENT: Head atraumatic, normocephalic. Oropharynx and nasopharynx clear.  NECK:  Supple, no jugular venous distention. No thyroid enlargement, no tenderness.  LUNGS: Normal breath sounds bilaterally, no wheezing, rales, rhonchi. No use of accessory muscles of respiration.  CARDIOVASCULAR: S1, S2 normal. No murmurs, rubs, or gallops.  ABDOMEN: Soft, nontender, nondistended. Bowel sounds present. No organomegaly or mass.  EXTREMITIES: No cyanosis, clubbing or edema b/l.    NEUROLOGIC: unable to assess given patient mental status PSYCHIATRIC:  patient is awake but not responding to verbal commands SKIN: No obvious rash, lesion, or ulcer.   LABORATORY PANEL:  CBC Recent Labs  Lab 10/24/18 0604  WBC 16.2*  HGB 13.4  HCT 40.6  PLT 190    Chemistries  Recent Labs  Lab 10/23/18 1433  10/24/18 0604  NA 132*   < > 137  K 4.2   < > 3.8  CL 96*   < > 111  CO2 11*   < > 15*  GLUCOSE 454*   < > 212*  BUN 17   < > 9  CREATININE 0.81   < > 0.66  CALCIUM  8.5*   < > 7.9*  MG  --   --  2.1  AST 40  --   --   ALT 34  --   --   ALKPHOS 89  --   --   BILITOT 2.4*  --   --    < > = values in this interval not displayed.   Cardiac Enzymes No results for input(s): TROPONINI in the last 168 hours. RADIOLOGY:  Dg Chest 1 View  Result Date: 10/23/2018 CLINICAL DATA:  Leukocytosis EXAM: CHEST  1 VIEW COMPARISON:  04/17/2017 FINDINGS: Lungs are clear.  No pleural effusion or pneumothorax. The heart is top-normal in size. IMPRESSION: No evidence of acute cardiopulmonary disease. Electronically Signed   By: Julian Hy M.D.   On: 10/23/2018 15:55   Ct Head Wo Contrast  Result Date: 10/24/2018 CLINICAL DATA:  Speech difficulty EXAM: CT HEAD WITHOUT CONTRAST TECHNIQUE: Contiguous axial images were obtained from the base of the skull through the vertex without intravenous contrast. COMPARISON:  None. FINDINGS: Brain: No evidence of acute infarction, hemorrhage, hydrocephalus, extra-axial collection or mass lesion/mass effect. Vascular: No hyperdense vessel or unexpected calcification. Skull: Normal. Negative for fracture or focal lesion. Sinuses/Orbits: No acute finding. IMPRESSION: Negative head CT. Electronically Signed   By: Monte Fantasia M.D.   On: 10/24/2018 04:35  Ct Abdomen Pelvis W Contrast  Result Date: 10/23/2018 CLINICAL DATA:  58 year old female with abdominal pain and dizziness. EXAM: CT ABDOMEN AND PELVIS WITH CONTRAST TECHNIQUE: Multidetector CT imaging of the abdomen and pelvis was performed using the standard protocol following bolus administration of intravenous contrast. CONTRAST:  OMNIPAQUE IOHEXOL 300 MG/ML  SOLN COMPARISON:  CT of the abdomen pelvis report dated 05/03/2007. The images are not available for direct comparison. FINDINGS: Lower chest: The visualized lung bases are clear. No intra-abdominal free air or free fluid. Hepatobiliary: There is diffuse fatty infiltration of the liver. No intrahepatic biliary ductal  dilatation. Cholecystectomy. No retained calcified stone noted in the central CBD. Pancreas: Unremarkable. No pancreatic ductal dilatation or surrounding inflammatory changes. Spleen: Normal in size without focal abnormality. Adrenals/Urinary Tract: The adrenal glands are unremarkable. There is no hydronephrosis on either side. Faint area of hypoenhancement in the upper pole of the right (series 2, image 27 and coronal series 5, image 66) as well as additional hypoenhancing areas in the lower pole and interpolar right kidney noted which are not well evaluated but may represent pyelonephritis. Correlation with urinalysis recommended. The visualized ureters and urinary bladder appear unremarkable. Stomach/Bowel: There is a small hiatal hernia. Minimal thickened appearance of the distal esophagus which may be related to underdistention or represent mild esophagitis. Clinical correlation is recommended. There is no bowel obstruction. The appendix is normal. Vascular/Lymphatic: The abdominal aorta and IVC are unremarkable. No portal venous gas. There is no adenopathy. Reproductive: Hysterectomy. No adnexal masses. Other: None Musculoskeletal: No acute or significant osseous findings. IMPRESSION: 1. Findings concerning for right-sided pyelonephritis. Correlation with urinalysis recommended. 2. No bowel obstruction or active inflammation. Normal appendix. 3. Fatty liver. 4. Small hiatal hernia. Underdistention versus mild distal esophagitis. Clinical correlation is recommended. Electronically Signed   By: Elgie Collard M.D.   On: 10/23/2018 16:58   ASSESSMENT AND PLAN:   58 year old female patient with a known history of hypertension, type 2 diabetes mellitus presented to the emergency room for nausea vomiting and abdominal discomfort.  Patient also has been dizzy for the last couple of days.  She was having polyuria polyphagia and excessive thirst.  *Diabetic ketoacidosis -AG normal -CO 2 15 Admit patient to  stepdown unit IV insulin drip Monitor blood sugars every hourly  *Sepsis secondary to pyelonephritis -Patient on broad-spectrum antibiotics Vanco and Zosyn--IV rocephin Follow-up cultures and taper antibiotics  *Altered mental status/encephalopathy/possible provoke seizures -by neurology -started on IV Keppra, EEG -consider checking urine drug screen and alcohol level  *Dehydration in the setting of DKA IV fluids  -DVT prophylaxis subcu Lovenox daily  CODE STATUS: full  DVT Prophylaxis: lovneox  TOTAL TIME TAKING CARE OF THIS PATIENT: *30* minutes.  >50% time spent on counselling and coordination of care  POSSIBLE D/C IN *?* DAYS, DEPENDING ON CLINICAL CONDITION.  Note: This dictation was prepared with Dragon dictation along with smaller phrase technology. Any transcriptional errors that result from this process are unintentional.  Enedina Finner M.D on 10/24/2018 at 1:42 PM  Between 7am to 6pm - Pager - [phone]  After 6pm go to [email or web address] - Social research officer, government  [facility]  Office  [phone]  CC: Primary care physician; Rodrigo Ran, MDPatient ID: Josephina Gip, female   DOB: March 03, 1960, 58 y.o.   MRN: [id number]

## 2018-10-24 NOTE — Progress Notes (Signed)
Insulin infusion not running at the start of this shift. Questioned overnight provider if patient needed to remain on insulin gtt as labs revealed CO2 of 15 and there was no documentation of long acting insulin administered before turning on continuous insulin infusion. Provider stated long acting insulin was ordered incorrectly and needed to include a dose to be administered now. Order changed. Stopped D5 infusion per provider order. Will monitor CBG's every 4 hours at this time.

## 2018-10-24 NOTE — Progress Notes (Signed)
[place] for Electrolyte Monitoring and Replacement   Recent Labs: Potassium (mmol/L)  Date Value  10/24/2018 3.8   Magnesium (mg/dL)  Date Value  10/24/2018 2.1   Calcium (mg/dL)  Date Value  10/24/2018 7.9 (L)   Albumin (g/dL)  Date Value  10/23/2018 4.2   Phosphorus (mg/dL)  Date Value  10/24/2018 1.8 (L)   Sodium (mmol/L)  Date Value  10/24/2018 137     Assessment: 58 year old female admitted with DKA. Patient with possible seizure activity this morning, started on Keppra. Patient currently lethargic, will avoid PO supplementation at this time and follow up ability to take oral meds as clinically indicated.  Goal of Therapy:  Electrolytes WNL  Plan:  Will give potassium phos 20 mmol IV x 1. Electrolytes with am labs.  Tawnya Crook ,PharmD Clinical Pharmacist 10/24/2018 2:18 PM

## 2018-10-24 NOTE — Progress Notes (Addendum)
Inpatient Diabetes Program Recommendations  AACE/ADA: New Consensus Statement on Inpatient Glycemic Control   Target Ranges:  Prepandial:   less than 140 mg/dL      Peak postprandial:   less than 180 mg/dL (1-2 hours)      Critically ill patients:  140 - 180 mg/dL  Results for Jenna Nelson, Jenna Nelson (MRN [id number]) as of 10/24/2018 10:47  Ref. Range 10/24/2018 00:22 10/24/2018 01:28 10/24/2018 02:22 10/24/2018 03:19 10/24/2018 04:03 10/24/2018 04:32 10/24/2018 07:51  Glucose-Capillary Latest Ref Range: 70 - 99 mg/dL 168 (H) 170 (H) 152 (H) 146 (H)     Lantus 10 units @ 3:10 163 (H)   177 (H)  Novolog 3 units 188 (H)  Novolog 3 units  Lantus 10 units @8 :11   Results for Jenna Nelson, Jenna Nelson (MRN [id number]) as of 10/24/2018 10:47  Ref. Range 10/23/2018 14:33  Beta-Hydroxybutyric Acid Latest Ref Range: 0.05 - 0.27 mmol/L 5.43 (H)  Glucose Latest Ref Range: 70 - 99 mg/dL 454 (H)   Review of Glycemic Control  Diabetes history: DM2 Outpatient Diabetes medications: Metformin XR 500 mg BID Current orders for Inpatient glycemic control: Lantus 10 units QHS, Novolog 0-15 units Q4H  Inpatient Diabetes Program Recommendations:   A1C: Noted A1C in process.   NOTE: Noted consult for Diabetes Coordinator. Chart reviewed. Per note on 10/23/18 by Dr. Jari Pigg, " She was recently started on prednisone on Monday.  She is taken 3 to 4 days of this.  About 2 days after starting the prednisone is when she started to feel her symptoms.  She has had generalized weakness and increased thirst.  She is supposed be taking 60 units of insulin the morning but missed her dose today due to just not feeling well.  She says she is not been keeping track of her sugars.  She was taking the steroids after her OB/GYN prescribed it for some vaginal ulcers." No insulin listed on home medication list. Will plan to speak with patient today.  Addendum 10/24/18@14 :10-Went by to talk with patient. Patient lying in bed with eyes  twitching and would not respond to voice or light touch. Spoke with Lovena Le, RN regarding patient.  Will plan to follow up tomorrow to see if patient is appropriate for conversation.  Thanks, Barnie Alderman, RN, MSN, CDE Diabetes Coordinator Inpatient Diabetes Program [phone] (Team Pager from 8am to 5pm)

## 2018-10-24 NOTE — Progress Notes (Signed)
Patients brother called requesting an update, no password documented in computer. Patient had started tracking with her eyes and twitching had stopped after lorazepam administered. Asked patient multiple times if it was ok to give her brother information and she repeatedly shook her head as to agree, also appropriately nodded head to other questions asked. Brother updated while in room in front of patient.  Notified Dr. Doy Mince that patient is now tracking with her eyes and communicating with head nods and shoulder shrugs appropriately after administration of 2 mg of lorazepam.

## 2018-10-24 NOTE — Procedures (Signed)
ELECTROENCEPHALOGRAM REPORT   Patient: Jenna Nelson       Room #: IC02A EEG No. ID: [id number] Age: 58 y.o.        Sex: female Referring Physician: Kasa Report Date:  10/24/2018        Interpreting Physician: Alexis Goodell  History: Jenna Nelson is an 58 y.o. female with seizure like activity  Medications:  Keppra, Rocephin, Insulin. Norvasc  Conditions of Recording:  This is a 21 channel routine scalp EEG performed with bipolar and monopolar montages arranged in accordance to the international 10/20 system of electrode placement. One channel was dedicated to EKG recording.  The patient is in the unresponsive state.  Description:  The background activity consists of a low voltage, symmetrical, fairly well organized, 9 Hz alpha activity, seen from the parieto-occipital and posterior temporal regions.  Low voltage fast activity, poorly organized, is seen anteriorly and is at times superimposed on more posterior regions.  A mixture of theta and alpha rhythms are seen from the central and temporal regions. Despite a normal awake background rhythm, with sternal rub the patient has no clinical response.   No epileptiform activity is noted.   Hyperventilation and intermittent photic stimulation were not performed.  IMPRESSION: Despite the patient being unresponsive clinically, a normal background rhythm is noted.  No epileptiform activity is noted.     Alexis Goodell, MD Neurology [phone] 10/24/2018, 2:14 PM

## 2018-10-24 NOTE — Consult Note (Signed)
Reason for Consult:Seizure Referring Physician: Kasa  CC: Seizure  HPI: Jenna Nelson is an 58 y.o. female  with a known history of hypertension, type 2 diabetes mellitus presented to the emergency room for dizziness, nausea, vomiting and abdominal discomfort.  Found to be in DKA and started on an insulin drip.  Today noted to have rhythmic eye fluttering, right eye deviation and unresponsiveness.    Past Medical History:  Diagnosis Date  . Diabetes mellitus without complication (HCC)   . Hypertension     Past Surgical History:  Procedure Laterality Date  . ABDOMINAL HYSTERECTOMY      History reviewed. No pertinent family history.  Social History:  reports that she has never smoked. She has never used smokeless tobacco. She reports previous alcohol use. She reports previous drug use.  Allergies  Allergen Reactions  . Codeine   . Sulfa Antibiotics     Medications:  I have reviewed the patient's current medications. Prior to Admission:  Medications Prior to Admission  Medication Sig Dispense Refill Last Dose  . ALPRAZolam (XANAX) 1 MG tablet Take 1 mg by mouth 3 (three) times daily as needed for anxiety.   4 prn at prn  . FLUoxetine (PROZAC) 40 MG capsule Take 40 mg by mouth daily.  4 Past Week at Unknown time  . amLODIPine-Valsartan-HCTZ 10-320-25 MG TABS Take 1 tablet by mouth daily.  4 Not Taking at Unknown time  . metFORMIN (GLUCOPHAGE-XR) 500 MG 24 hr tablet Take 1 tablet by mouth 2 (two) times daily with a meal.  3   . omeprazole (PRILOSEC) 20 MG capsule Take 20 mg by mouth 2 (two) times daily.  6   . temazepam (RESTORIL) 30 MG capsule Take 1 capsule by mouth at bedtime as needed for sleep.   4    Scheduled: . amLODipine  10 mg Oral Daily  . Chlorhexidine Gluconate Cloth  6 each Topical Daily  . enoxaparin (LOVENOX) injection  40 mg Subcutaneous Q24H  . insulin aspart  0-15 Units Subcutaneous Q4H  . insulin glargine  10 Units Subcutaneous QHS    ROS: Unable to  provide due to unresponsiveness.    Physical Examination: Blood pressure (!) 167/88, pulse (!) 116, temperature 98.9 F (37.2 C), temperature source Axillary, resp. rate (!) 28, height 5\' 1"  (1.549 m), weight 77.1 kg, SpO2 95 %.  HEENT-  Normocephalic, no lesions, without obvious abnormality.  Normal external eye and conjunctiva.  Normal TM's bilaterally.  Normal auditory canals and external ears. Normal external nose, mucus membranes and septum.  Normal pharynx. Cardiovascular- S1, S2 normal, pulses palpable throughout   Lungs- chest clear, no wheezing, rales, normal symmetric air entry Abdomen- soft, non-tender; bowel sounds normal; no masses,  no organomegaly Extremities- mild BLE edema Lymph-no adenopathy palpable Musculoskeletal-no joint tenderness, deformity or swelling Skin-warm and dry, no hyperpigmentation, vitiligo, or suspicious lesions  Neurological Examination   Mental Status: Patient does not respond to verbal stimuli.  Does not respond to deep sternal rub.  Does not follow commands.  No verbalizations noted.  Cranial Nerves: II: patient does not respond confrontation bilaterally, pupils right 5 mm, left 5 mm,and reactive bilaterally III,IV,VI: Patient does no cross midline with oculocephalic maneuver.  Right eye deviation V,VII: corneal reflex present bilaterally.  Rhythmical eye blinking noted and sustained  VIII: patient does not respond to verbal stimuli IX,X: gag reflex unable to test, XI: trapezius strength unable to test bilaterally XII: tongue strength unable to test Motor: Extremities fall to bedside  when raised but UE's do not fall to face Sensory: Does not respond to noxious stimuli in any extremity. Deep Tendon Reflexes:  Symmetric throughout. Plantars: Mute bilaterally Cerebellar: Unable to perform     Laboratory Studies:   Basic Metabolic Panel: Recent Labs  Lab 10/23/18 1433 10/23/18 2021 10/24/18 0604  NA 132* 135 137  K 4.2 4.1 3.8  CL 96*  106 111  CO2 11* 15* 15*  GLUCOSE 454* 202* 212*  BUN 17 13 9   CREATININE 0.81 0.71  0.77 0.66  CALCIUM 8.5* 8.3* 7.9*  MG  --   --  2.1  PHOS  --   --  1.8*    Liver Function Tests: Recent Labs  Lab 10/23/18 1433  AST 40  ALT 34  ALKPHOS 89  BILITOT 2.4*  PROT 7.6  ALBUMIN 4.2   Recent Labs  Lab 10/23/18 1433  LIPASE 12   No results for input(s): AMMONIA in the last 168 hours.  CBC: Recent Labs  Lab 10/23/18 1433 10/23/18 2021 10/24/18 0604  WBC 24.3* 20.3* 16.2*  HGB 14.6 13.8 13.4  HCT 43.3 41.5 40.6  MCV 89.1 87.9 89.4  PLT 300 249 190    Cardiac Enzymes: No results for input(s): CKTOTAL, CKMB, CKMBINDEX, TROPONINI in the last 168 hours.  BNP: Invalid input(s): POCBNP  CBG: Recent Labs  Lab 10/24/18 0222 10/24/18 0319 10/24/18 0403 10/24/18 0432 10/24/18 0751  GLUCAP 152* 146* 163* 177* 188*    Microbiology: Results for orders placed or performed during the hospital encounter of 10/23/18  SARS Coronavirus 2 by RT PCR (hospital order, performed in [facility] hospital lab) Nasopharyngeal Nasopharyngeal Swab     Status: None   Collection Time: 10/23/18  3:54 PM   Specimen: Nasopharyngeal Swab  Result Value Ref Range Status   SARS Coronavirus 2 NEGATIVE NEGATIVE Final    Comment: (NOTE) If result is NEGATIVE SARS-CoV-2 target nucleic acids are NOT DETECTED. The SARS-CoV-2 RNA is generally detectable in upper and lower  respiratory specimens during the acute phase of infection. The lowest  concentration of SARS-CoV-2 viral copies this assay can detect is 250  copies / mL. A negative result does not preclude SARS-CoV-2 infection  and should not be used as the sole basis for treatment or other  patient management decisions.  A negative result may occur with  improper specimen collection / handling, submission of specimen other  than nasopharyngeal swab, presence of viral mutation(s) within the  areas targeted by this assay, and inadequate  number of viral copies  (<250 copies / mL). A negative result must be combined with clinical  observations, patient history, and epidemiological information. If result is POSITIVE SARS-CoV-2 target nucleic acids are DETECTED. The SARS-CoV-2 RNA is generally detectable in upper and lower  respiratory specimens dur ing the acute phase of infection.  Positive  results are indicative of active infection with SARS-CoV-2.  Clinical  correlation with patient history and other diagnostic information is  necessary to determine patient infection status.  Positive results do  not rule out bacterial infection or co-infection with other viruses. If result is PRESUMPTIVE POSTIVE SARS-CoV-2 nucleic acids MAY BE PRESENT.   A presumptive positive result was obtained on the submitted specimen  and confirmed on repeat testing.  While 2019 novel coronavirus  (SARS-CoV-2) nucleic acids may be present in the submitted sample  additional confirmatory testing may be necessary for epidemiological  and / or clinical management purposes  to differentiate between  SARS-CoV-2 and other  Sarbecovirus currently known to infect humans.  If clinically indicated additional testing with an alternate test  methodology [phone]) is advised. The SARS-CoV-2 RNA is generally  detectable in upper and lower respiratory sp ecimens during the acute  phase of infection. The expected result is Negative. Fact Sheet for Patients:  StrictlyIdeas.no Fact Sheet for Healthcare Providers: BankingDealers.co.za This test is not yet approved or cleared by the [place] FDA and has been authorized for detection and/or diagnosis of SARS-CoV-2 by FDA under an Emergency Use Authorization (EUA).  This EUA will remain in effect (meaning this test can be used) for the duration of the COVID-19 declaration under Section 564(b)(1) of the Act, 21 U.S.C. section 360bbb-3(b)(1), unless the  authorization is terminated or revoked sooner. Performed at [facility], [address] [place], [place] [address]   Blood culture (routine x 2)     Status: None (Preliminary result)   Collection Time: 10/23/18  6:03 PM   Specimen: BLOOD  Result Value Ref Range Status   Specimen Description BLOOD R WRIST  Final   Special Requests   Final    BOTTLES DRAWN AEROBIC AND ANAEROBIC Blood Culture adequate volume   Culture   Final    NO GROWTH < 12 HOURS Performed at [facility], [address] [place], [place] [address]    Report Status PENDING  Incomplete  Blood culture (routine x 2)     Status: None (Preliminary result)   Collection Time: 10/23/18  6:03 PM   Specimen: BLOOD  Result Value Ref Range Status   Specimen Description BLOOD R AC  Final   Special Requests   Final    BOTTLES DRAWN AEROBIC AND ANAEROBIC Blood Culture results may not be optimal due to an excessive volume of blood received in culture bottles   Culture   Final    NO GROWTH < 12 HOURS Performed at [facility], [address] [place], [place] [address]    Report Status PENDING  Incomplete  MRSA PCR Screening     Status: None   Collection Time: 10/23/18  8:33 PM   Specimen: Nasopharyngeal  Result Value Ref Range Status   MRSA by PCR NEGATIVE NEGATIVE Final    Comment:        The GeneXpert MRSA Assay (FDA approved for NASAL specimens only), is one component of a comprehensive MRSA colonization surveillance program. It is not intended to diagnose MRSA infection nor to guide or monitor treatment for MRSA infections. Performed at [facility], [address] [place], [place] [address]     Coagulation Studies: No results for input(s): LABPROT, INR in the last 72 hours.  Urinalysis:  Recent Labs  Lab 10/23/18 1433  COLORURINE STRAW*  LABSPEC 1.026  PHURINE 5.0  GLUCOSEU >=500*  HGBUR MODERATE*  BILIRUBINUR NEGATIVE  KETONESUR 80*  PROTEINUR NEGATIVE   NITRITE NEGATIVE  LEUKOCYTESUR NEGATIVE    Lipid Panel:  No results found for: CHOL, TRIG, HDL, CHOLHDL, VLDL, LDLCALC  HgbA1C: No results found for: HGBA1C  Urine Drug Screen:  No results found for: LABOPIA, COCAINSCRNUR, LABBENZ, AMPHETMU, THCU, LABBARB  Alcohol Level: No results for input(s): ETH in the last 168 hours.  Other results: EKG: sinus tachycardia at 121 bpm.  Imaging: Dg Chest 1 View  Result Date: 10/23/2018 CLINICAL DATA:  Leukocytosis EXAM: CHEST  1 VIEW COMPARISON:  04/17/2017 FINDINGS: Lungs are clear.  No pleural effusion or pneumothorax. The heart is top-normal in size. IMPRESSION: No evidence of acute  cardiopulmonary disease. Electronically Signed   By: Charline Bills M.D.   On: 10/23/2018 15:55   Ct Head Wo Contrast  Result Date: 10/24/2018 CLINICAL DATA:  Speech difficulty EXAM: CT HEAD WITHOUT CONTRAST TECHNIQUE: Contiguous axial images were obtained from the base of the skull through the vertex without intravenous contrast. COMPARISON:  None. FINDINGS: Brain: No evidence of acute infarction, hemorrhage, hydrocephalus, extra-axial collection or mass lesion/mass effect. Vascular: No hyperdense vessel or unexpected calcification. Skull: Normal. Negative for fracture or focal lesion. Sinuses/Orbits: No acute finding. IMPRESSION: Negative head CT. Electronically Signed   By: Marnee Spring M.D.   On: 10/24/2018 04:35   Ct Abdomen Pelvis W Contrast  Result Date: 10/23/2018 CLINICAL DATA:  58 year old female with abdominal pain and dizziness. EXAM: CT ABDOMEN AND PELVIS WITH CONTRAST TECHNIQUE: Multidetector CT imaging of the abdomen and pelvis was performed using the standard protocol following bolus administration of intravenous contrast. CONTRAST:  OMNIPAQUE IOHEXOL 300 MG/ML  SOLN COMPARISON:  CT of the abdomen pelvis report dated 05/03/2007. The images are not available for direct comparison. FINDINGS: Lower chest: The visualized lung bases are clear.  No intra-abdominal free air or free fluid. Hepatobiliary: There is diffuse fatty infiltration of the liver. No intrahepatic biliary ductal dilatation. Cholecystectomy. No retained calcified stone noted in the central CBD. Pancreas: Unremarkable. No pancreatic ductal dilatation or surrounding inflammatory changes. Spleen: Normal in size without focal abnormality. Adrenals/Urinary Tract: The adrenal glands are unremarkable. There is no hydronephrosis on either side. Faint area of hypoenhancement in the upper pole of the right (series 2, image 27 and coronal series 5, image 66) as well as additional hypoenhancing areas in the lower pole and interpolar right kidney noted which are not well evaluated but may represent pyelonephritis. Correlation with urinalysis recommended. The visualized ureters and urinary bladder appear unremarkable. Stomach/Bowel: There is a small hiatal hernia. Minimal thickened appearance of the distal esophagus which may be related to underdistention or represent mild esophagitis. Clinical correlation is recommended. There is no bowel obstruction. The appendix is normal. Vascular/Lymphatic: The abdominal aorta and IVC are unremarkable. No portal venous gas. There is no adenopathy. Reproductive: Hysterectomy. No adnexal masses. Other: None Musculoskeletal: No acute or significant osseous findings. IMPRESSION: 1. Findings concerning for right-sided pyelonephritis. Correlation with urinalysis recommended. 2. No bowel obstruction or active inflammation. Normal appendix. 3. Fatty liver. 4. Small hiatal hernia. Underdistention versus mild distal esophagitis. Clinical correlation is recommended. Electronically Signed   By: Elgie Collard M.D.   On: 10/23/2018 16:58     Assessment/Plan: 58 year old female presenting with DKA.  Noted to have seizure-like activity this morning.  Patient given  Ativan with resolution of activity.  Seizure likely provoked.  Patient in DKA but also on Xanax at home.   Unclear how many she is taking at home and may be having some withdrawal.    Recommendations: 1. Would restart Xanax 2. Keppra  IV now with maintenance of  BID  3. MRI of the brain with and without contrast 4. EEG 5. If above work up unremarkable would consider taper of Keppra.   6. Seizure precautions 7. Patient unable to drive, operate heavy machinery, perform activities at heights and participate in water activities until release by outpatient physician.   Thana Farr, MD Neurology [phone] 10/24/2018, 11:04 AM

## 2018-10-24 NOTE — Progress Notes (Signed)
Upon admittance to CCU, patient was responsive to questions concerning pain and level of comfort, and obey commands to move feet and grip hands.  She replied "no" when asked if she was in pain additionally, she replied "yes" when asked if she was comfortable.    As shift continued, patient progressively develop a flat affect and was less interactive with staff.  Patient would not make eye contact.  Patient was not responsive to questions concerning pain and comfort.  Nurse practitioner was notified.

## 2018-10-25 DIAGNOSIS — N12 Tubulo-interstitial nephritis, not specified as acute or chronic: Secondary | ICD-10-CM | POA: Diagnosis not present

## 2018-10-25 DIAGNOSIS — E1110 Type 2 diabetes mellitus with ketoacidosis without coma: Secondary | ICD-10-CM | POA: Diagnosis not present

## 2018-10-25 DIAGNOSIS — A419 Sepsis, unspecified organism: Secondary | ICD-10-CM | POA: Diagnosis not present

## 2018-10-25 DIAGNOSIS — E860 Dehydration: Secondary | ICD-10-CM | POA: Diagnosis not present

## 2018-10-25 DIAGNOSIS — R569 Unspecified convulsions: Secondary | ICD-10-CM | POA: Diagnosis not present

## 2018-10-25 LAB — BASIC METABOLIC PANEL
Anion gap: 12 (ref 5–15)
BUN: 14 mg/dL (ref 6–20)
CO2: 17 mmol/L — ABNORMAL LOW (ref 22–32)
Calcium: 8.1 mg/dL — ABNORMAL LOW (ref 8.9–10.3)
Chloride: 114 mmol/L — ABNORMAL HIGH (ref 98–111)
Creatinine, Ser: 0.66 mg/dL (ref 0.44–1.00)
GFR calc Af Amer: >60 mL/min (ref >60)
GFR calc non Af Amer: >60 mL/min (ref >60)
Glucose, Bld: 126 mg/dL — ABNORMAL HIGH (ref 70–99)
Potassium: 3.4 mmol/L — ABNORMAL LOW (ref 3.5–5.1)
Sodium: 143 mmol/L (ref 135–145)

## 2018-10-25 LAB — HEMOGLOBIN A1C
Hgb A1c MFr Bld: 11.2 % — ABNORMAL HIGH (ref 4.8–5.6)
Mean Plasma Glucose: 275 mg/dL

## 2018-10-25 LAB — CBC WITH DIFFERENTIAL/PLATELET
Abs Immature Granulocytes: 0.23 10*3/uL — ABNORMAL HIGH (ref 0.00–0.07)
Basophils Absolute: 0.1 10*3/uL (ref 0.0–0.1)
Basophils Relative: 1 %
Eosinophils Absolute: 0 10*3/uL (ref 0.0–0.5)
Eosinophils Relative: 0 %
HCT: 40.9 % (ref 36.0–46.0)
Hemoglobin: 13.2 g/dL (ref 12.0–15.0)
Immature Granulocytes: 2 %
Lymphocytes Relative: 16 %
Lymphs Abs: 1.9 10*3/uL (ref 0.7–4.0)
MCH: 29.3 pg (ref 26.0–34.0)
MCHC: 32.3 g/dL (ref 30.0–36.0)
MCV: 90.9 fL (ref 80.0–100.0)
Monocytes Absolute: 0.6 10*3/uL (ref 0.1–1.0)
Monocytes Relative: 5 %
Neutro Abs: 9.2 10*3/uL — ABNORMAL HIGH (ref 1.7–7.7)
Neutrophils Relative %: 76 %
Platelets: 150 10*3/uL (ref 150–400)
RBC: 4.5 MIL/uL (ref 3.87–5.11)
RDW: 13.6 % (ref 11.5–15.5)
WBC: 12 10*3/uL — ABNORMAL HIGH (ref 4.0–10.5)
nRBC: 0 % (ref 0.0–0.2)

## 2018-10-25 LAB — PHOSPHORUS: Phosphorus: 2.5 mg/dL (ref 2.5–4.6)

## 2018-10-25 LAB — GLUCOSE, CAPILLARY
Glucose-Capillary: 104 mg/dL — ABNORMAL HIGH (ref 70–99)
Glucose-Capillary: 109 mg/dL — ABNORMAL HIGH (ref 70–99)
Glucose-Capillary: 117 mg/dL — ABNORMAL HIGH (ref 70–99)
Glucose-Capillary: 118 mg/dL — ABNORMAL HIGH (ref 70–99)
Glucose-Capillary: 127 mg/dL — ABNORMAL HIGH (ref 70–99)
Glucose-Capillary: 169 mg/dL — ABNORMAL HIGH (ref 70–99)
Glucose-Capillary: 99 mg/dL (ref 70–99)

## 2018-10-25 LAB — MAGNESIUM: Magnesium: 2.3 mg/dL (ref 1.7–2.4)

## 2018-10-25 MED ORDER — LEVETIRACETAM 250 MG PO TABS
250.0000 mg | ORAL_TABLET | Freq: Two times a day (BID) | ORAL | Status: AC
  Administered 2018-10-25 (×2): 250 mg via ORAL
  Filled 2018-10-25 (×2): qty 1

## 2018-10-25 MED ORDER — LIVING WELL WITH DIABETES BOOK
Freq: Once | Status: DC
  Filled 2018-10-25: qty 1

## 2018-10-25 MED ORDER — POTASSIUM CHLORIDE 20 MEQ PO PACK
40.0000 meq | PACK | Freq: Three times a day (TID) | ORAL | Status: DC
  Administered 2018-10-25: 40 meq via ORAL
  Filled 2018-10-25 (×3): qty 2

## 2018-10-25 MED ORDER — ALPRAZOLAM 1 MG PO TABS
1.0000 mg | ORAL_TABLET | Freq: Three times a day (TID) | ORAL | Status: DC | PRN
  Administered 2018-10-26 – 2018-10-27 (×2): 1 mg via ORAL
  Filled 2018-10-25 (×2): qty 1

## 2018-10-25 NOTE — Plan of Care (Signed)
Nutrition Education Note   RD consulted for nutrition education regarding diabetes.   58 year old female patient with a known history of hypertension, type 2 diabetes mellitus presented to the emergency room for nausea vomiting and abdominal discomfort. Pt found to have sepsis secondary to pyelonephritis and DKA   Lab Results  Component Value Date   HGBA1C 11.2 (H) 10/23/2018   Met with patient in room today. Pt is having moments of slight confusion at time of RD visit but is able to ask appropriate questions and answer questions appropriately. Pt reports poor appetite and oral intake for 2 weeks pta r/t abdominal pain and nausea. Pt reports that her appetite has improved today; pt ate 100% of her breakfast and lunch but reports that she "overdid it" because she had diarrhea after lunch. Pt drinks carnation instant breakfast with 2% milk at home and is requesting this while in hospital as well.   This is not a new diagnosis for this patient. Pt reports that she has been struggling with her diabetes for "a long time now" and reports that her medication regimen is overwhelming and that she is considering switching over to an insulin pump. Pt reports also being non-compliant with diet. Pt reports that she knows she has been eating stuff that she shouldn't and gives examples such as cakes, cookies and sugary cereals.   RD provided "Nutrition and Type II Diabetes" handout from the Academy of Nutrition and Dietetics. Discussed different food groups and their effects on blood sugar, emphasizing carbohydrate-containing foods. Provided list of carbohydrates and recommended serving sizes of common foods.  Discussed importance of controlled and consistent carbohydrate intake throughout the day. Provided examples of ways to balance meals/snacks and encouraged intake of high-fiber, whole grain complex carbohydrates. Teach back method used.  Expect fair compliance. Pt would likely benefit from outpatient  referral to the [facility] for further help with her diabetes management.    Body mass index is 32.12 kg/m. Pt meets criteria for obesity based on current BMI.  Current diet order is HH/CHO patient is consuming approximately 100% of meals at this time. Labs and medications reviewed.   RD will add Carnation Instant Breakfast BID- each packet provides 130kcal and 5g protein   No further nutrition interventions warranted at this time. RD contact information provided. If additional nutrition issues arise, please re-consult RD.  Koleen Distance MS, RD, LDN Pager #- [phone] Office#- [phone] After Hours Pager: [phone]

## 2018-10-25 NOTE — Progress Notes (Signed)
[facility] for Electrolyte Monitoring and Replacement   Recent Labs: Potassium (mmol/L)  Date Value  10/25/2018 3.4 (L)   Magnesium (mg/dL)  Date Value  10/25/2018 2.3   Calcium (mg/dL)  Date Value  10/25/2018 8.1 (L)   Albumin (g/dL)  Date Value  10/23/2018 4.2   Phosphorus (mg/dL)  Date Value  10/25/2018 2.5   Sodium (mmol/L)  Date Value  10/25/2018 143     Assessment: 58 year old female admitted with DKA. Patient had a possible seizure activity on 10/19 and was given Keppra. Patient is awake and alert, with no episodes of unresponsiveness. Can transition back to oral medications since patient is not lethargic nor NPO.     Goal of Therapy:  Electrolytes WNL  Plan:  Will give potassium 40 mEq po x2 Recheck electrolytes with am labs   Sallye Lat ,PharmD Candidate 10/25/2018 11:07 AM

## 2018-10-25 NOTE — Progress Notes (Signed)
Vital signs reviewed, ICU needs resolved  Will sign off at this time. No further recommendations at this time.  Please call [phone] for further questions. Thank you.    Iana Buzan David Story Vanvranken, M.D.  [facility]  Medical Director ICU-[facility] Medical Director [facility] Cardio-Pulmonary Department   

## 2018-10-25 NOTE — Progress Notes (Addendum)
Subjective: Patient awake and alert today.  No further episodes of unresponsiveness noted.    Objective: Current vital signs: BP 138/74   Pulse 73   Temp 98.4 F (36.9 C) (Oral)   Resp (!) 24   Ht 5\' 1"  (1.549 m)   Wt 77.1 kg   SpO2 97%   BMI 32.12 kg/m  Vital signs in last 24 hours: Temp:  [98.2 F (36.8 C)-99.4 F (37.4 C)] 98.4 F (36.9 C) (10/20 0800) Pulse Rate:  [73-129] 73 (10/20 0900) Resp:  [20-41] 24 (10/20 1000) BP: (132-157)/(74-96) 138/74 (10/20 0900) SpO2:  [92 %-97 %] 97 % (10/20 0900)  Intake/Output from previous day: 10/19 0701 - 10/20 0700 In: 1544.9 [I.V.:1251.1; IV Piggyback:293.8] Out: 1500 [Urine:1500] Intake/Output this shift: Total I/O In: 100 [IV Piggyback:100] Out: 150 [Urine:150] Nutritional status:  Diet Order            Diet heart healthy/carb modified Room service appropriate? Yes; Fluid consistency: Thin  Diet effective now              Neurologic Exam: Mental Status: Alert, oriented, thought content appropriate.  Speech fluent without evidence of aphasia.  Able to follow 3 step commands without difficulty. Cranial Nerves: II: Discs flat bilaterally; Visual fields grossly normal, pupils equal, round, reactive to light and accommodation III,IV, VI: mild left ptosis, extra-ocular motions intact bilaterally V,VII: smile symmetric, facial light touch sensation normal bilaterally VIII: hearing normal bilaterally IX,X: gag reflex present XI: bilateral shoulder shrug XII: midline tongue extension Motor: Right : Upper extremity   5/5    Left:     Upper extremity   5/5  Lower extremity   5/5     Lower extremity   5/5 Tone and bulk:normal tone throughout; no atrophy noted Sensory: Pinprick and light touch intact throughout, bilaterally  Lab Results: Basic Metabolic Panel: Recent Labs  Lab 10/23/18 1433 10/23/18 2021 10/24/18 0604 10/25/18 0428  NA 132* 135 137 143  K 4.2 4.1 3.8 3.4*  CL 96* 106 111 114*  CO2 11* 15* 15* 17*   GLUCOSE 454* 202* 212* 126*  BUN 17 13 9 14   CREATININE 0.81 0.71  0.77 0.66 0.66  CALCIUM 8.5* 8.3* 7.9* 8.1*  MG  --   --  2.1 2.3  PHOS  --   --  1.8* 2.5    Liver Function Tests: Recent Labs  Lab 10/23/18 1433  AST 40  ALT 34  ALKPHOS 89  BILITOT 2.4*  PROT 7.6  ALBUMIN 4.2   Recent Labs  Lab 10/23/18 1433  LIPASE 12   No results for input(s): AMMONIA in the last 168 hours.  CBC: Recent Labs  Lab 10/23/18 1433 10/23/18 2021 10/24/18 0604 10/25/18 0428  WBC 24.3* 20.3* 16.2* 12.0*  NEUTROABS  --   --   --  9.2*  HGB 14.6 13.8 13.4 13.2  HCT 43.3 41.5 40.6 40.9  MCV 89.1 87.9 89.4 90.9  PLT 300 249 190 150    Cardiac Enzymes: No results for input(s): CKTOTAL, CKMB, CKMBINDEX, TROPONINI in the last 168 hours.  Lipid Panel: No results for input(s): CHOL, TRIG, HDL, CHOLHDL, VLDL, LDLCALC in the last 168 hours.  CBG: Recent Labs  Lab 10/24/18 1553 10/24/18 2008 10/25/18 0015 10/25/18 0447 10/25/18 0736  GLUCAP 123* 128* 104* 117* 118*    Microbiology: Results for orders placed or performed during the hospital encounter of 10/23/18  SARS Coronavirus 2 by RT PCR (hospital order, performed in [facility] hospital lab)  Nasopharyngeal Nasopharyngeal Swab     Status: None   Collection Time: 10/23/18  3:54 PM   Specimen: Nasopharyngeal Swab  Result Value Ref Range Status   SARS Coronavirus 2 NEGATIVE NEGATIVE Final    Comment: (NOTE) If result is NEGATIVE SARS-CoV-2 target nucleic acids are NOT DETECTED. The SARS-CoV-2 RNA is generally detectable in upper and lower  respiratory specimens during the acute phase of infection. The lowest  concentration of SARS-CoV-2 viral copies this assay can detect is 250  copies / mL. A negative result does not preclude SARS-CoV-2 infection  and should not be used as the sole basis for treatment or other  patient management decisions.  A negative result may occur with  improper specimen collection / handling,  submission of specimen other  than nasopharyngeal swab, presence of viral mutation(s) within the  areas targeted by this assay, and inadequate number of viral copies  (<250 copies / mL). A negative result must be combined with clinical  observations, patient history, and epidemiological information. If result is POSITIVE SARS-CoV-2 target nucleic acids are DETECTED. The SARS-CoV-2 RNA is generally detectable in upper and lower  respiratory specimens dur ing the acute phase of infection.  Positive  results are indicative of active infection with SARS-CoV-2.  Clinical  correlation with patient history and other diagnostic information is  necessary to determine patient infection status.  Positive results do  not rule out bacterial infection or co-infection with other viruses. If result is PRESUMPTIVE POSTIVE SARS-CoV-2 nucleic acids MAY BE PRESENT.   A presumptive positive result was obtained on the submitted specimen  and confirmed on repeat testing.  While 2019 novel coronavirus  (SARS-CoV-2) nucleic acids may be present in the submitted sample  additional confirmatory testing may be necessary for epidemiological  and / or clinical management purposes  to differentiate between  SARS-CoV-2 and other Sarbecovirus currently known to infect humans.  If clinically indicated additional testing with an alternate test  methodology [phone]) is advised. The SARS-CoV-2 RNA is generally  detectable in upper and lower respiratory sp ecimens during the acute  phase of infection. The expected result is Negative. Fact Sheet for Patients:  StrictlyIdeas.no Fact Sheet for Healthcare Providers: BankingDealers.co.za This test is not yet approved or cleared by the [place] FDA and has been authorized for detection and/or diagnosis of SARS-CoV-2 by FDA under an Emergency Use Authorization (EUA).  This EUA will remain in effect (meaning this test can be  used) for the duration of the COVID-19 declaration under Section 564(b)(1) of the Act, 21 U.S.C. section 360bbb-3(b)(1), unless the authorization is terminated or revoked sooner. Performed at [facility], [address] [place], [place] [address]   Blood culture (routine x 2)     Status: None (Preliminary result)   Collection Time: 10/23/18  6:03 PM   Specimen: BLOOD  Result Value Ref Range Status   Specimen Description BLOOD R WRIST  Final   Special Requests   Final    BOTTLES DRAWN AEROBIC AND ANAEROBIC Blood Culture adequate volume   Culture   Final    NO GROWTH 2 DAYS Performed at [facility], [address] [place], [place] [address]    Report Status PENDING  Incomplete  Blood culture (routine x 2)     Status: None (Preliminary result)   Collection Time: 10/23/18  6:03 PM   Specimen: BLOOD  Result Value Ref Range Status   Specimen Description BLOOD R [facility]  Final   Special Requests  Final    BOTTLES DRAWN AEROBIC AND ANAEROBIC Blood Culture results may not be optimal due to an excessive volume of blood received in culture bottles   Culture   Final    NO GROWTH 2 DAYS Performed at [facility], [address] [place], [place] [address]    Report Status PENDING  Incomplete  MRSA PCR Screening     Status: None   Collection Time: 10/23/18  8:33 PM   Specimen: Nasopharyngeal  Result Value Ref Range Status   MRSA by PCR NEGATIVE NEGATIVE Final    Comment:        The GeneXpert MRSA Assay (FDA approved for NASAL specimens only), is one component of a comprehensive MRSA colonization surveillance program. It is not intended to diagnose MRSA infection nor to guide or monitor treatment for MRSA infections. Performed at [facility], [address] [place], [place] [address]     Coagulation Studies: No results for input(s): LABPROT, INR in the last 72 hours.  Imaging: Dg Chest 1 View  Result Date: 10/23/2018 CLINICAL DATA:   Leukocytosis EXAM: CHEST  1 VIEW COMPARISON:  04/17/2017 FINDINGS: Lungs are clear.  No pleural effusion or pneumothorax. The heart is top-normal in size. IMPRESSION: No evidence of acute cardiopulmonary disease. Electronically Signed   By: Charline BillsSriyesh  Krishnan M.D.   On: 10/23/2018 15:55   Ct Head Wo Contrast  Result Date: 10/24/2018 CLINICAL DATA:  Speech difficulty EXAM: CT HEAD WITHOUT CONTRAST TECHNIQUE: Contiguous axial images were obtained from the base of the skull through the vertex without intravenous contrast. COMPARISON:  None. FINDINGS: Brain: No evidence of acute infarction, hemorrhage, hydrocephalus, extra-axial collection or mass lesion/mass effect. Vascular: No hyperdense vessel or unexpected calcification. Skull: Normal. Negative for fracture or focal lesion. Sinuses/Orbits: No acute finding. IMPRESSION: Negative head CT. Electronically Signed   By: Marnee SpringJonathon  Watts M.D.   On: 10/24/2018 04:35   Mr Laqueta JeanBrain W UJWo Contrast  Result Date: 10/24/2018 CLINICAL DATA:  Seizure. Eye deviation and unresponsiveness. DKA. EXAM: MRI HEAD WITHOUT AND WITH CONTRAST TECHNIQUE: Multiplanar, multiecho pulse sequences of the brain and surrounding structures were obtained without and with intravenous contrast. CONTRAST:  7mL GADAVIST GADOBUTROL 1 MMOL/ML IV SOLN COMPARISON:  Head CT 10/24/2018 FINDINGS: Brain: There is no evidence of acute infarct, intracranial hemorrhage, mass, midline shift, or extra-axial fluid collection. The ventricles and sulci are within normal limits for age. T2 hyperintensities in the cerebral white matter are nonspecific but compatible with mild chronic small vessel ischemic disease. No abnormal enhancement is identified. Dedicated temporal lobe imaging is mildly motion degraded with grossly symmetric hippocampal size and no gross signal abnormality. Hippocampal sulcus remnant cysts are incidentally noted bilaterally. Vascular: Major intracranial vascular flow voids are preserved. Skull  and upper cervical spine: Unremarkable bone marrow signal. Sinuses/Orbits: Unremarkable orbits. Subcentimeter right maxillary sinus mucous retention cyst. Clear mastoid air cells. Other: None. IMPRESSION: 1. No acute intracranial abnormality. 2. Mild chronic small vessel ischemic disease. Electronically Signed   By: Sebastian AcheAllen  Grady M.D.   On: 10/24/2018 17:19   Ct Abdomen Pelvis W Contrast  Result Date: 10/23/2018 CLINICAL DATA:  58 year old female with abdominal pain and dizziness. EXAM: CT ABDOMEN AND PELVIS WITH CONTRAST TECHNIQUE: Multidetector CT imaging of the abdomen and pelvis was performed using the standard protocol following bolus administration of intravenous contrast. CONTRAST:  100mL OMNIPAQUE IOHEXOL 300 MG/ML  SOLN COMPARISON:  CT of the abdomen pelvis report dated 05/03/2007. The images are not available for direct comparison. FINDINGS:  Lower chest: The visualized lung bases are clear. No intra-abdominal free air or free fluid. Hepatobiliary: There is diffuse fatty infiltration of the liver. No intrahepatic biliary ductal dilatation. Cholecystectomy. No retained calcified stone noted in the central CBD. Pancreas: Unremarkable. No pancreatic ductal dilatation or surrounding inflammatory changes. Spleen: Normal in size without focal abnormality. Adrenals/Urinary Tract: The adrenal glands are unremarkable. There is no hydronephrosis on either side. Faint area of hypoenhancement in the upper pole of the right (series 2, image 27 and coronal series 5, image 66) as well as additional hypoenhancing areas in the lower pole and interpolar right kidney noted which are not well evaluated but may represent pyelonephritis. Correlation with urinalysis recommended. The visualized ureters and urinary bladder appear unremarkable. Stomach/Bowel: There is a small hiatal hernia. Minimal thickened appearance of the distal esophagus which may be related to underdistention or represent mild esophagitis. Clinical  correlation is recommended. There is no bowel obstruction. The appendix is normal. Vascular/Lymphatic: The abdominal aorta and IVC are unremarkable. No portal venous gas. There is no adenopathy. Reproductive: Hysterectomy. No adnexal masses. Other: None Musculoskeletal: No acute or significant osseous findings. IMPRESSION: 1. Findings concerning for right-sided pyelonephritis. Correlation with urinalysis recommended. 2. No bowel obstruction or active inflammation. Normal appendix. 3. Fatty liver. 4. Small hiatal hernia. Underdistention versus mild distal esophagitis. Clinical correlation is recommended. Electronically Signed   By: Elgie Collard M.D.   On: 10/23/2018 16:58    Medications:  I have reviewed the patient's current medications. Scheduled: . amLODipine  10 mg Oral Daily  . Chlorhexidine Gluconate Cloth  6 each Topical Daily  . enoxaparin (LOVENOX) injection  40 mg Subcutaneous Q24H  . insulin aspart  0-15 Units Subcutaneous Q4H  . insulin glargine  10 Units Subcutaneous QHS    Assessment/Plan: 58 year old female with seizure like activity.  Patient now back to baseline.  Keppra was started and continued at a maintenance of  BID.  EEG with a normal background.  MRI of the brain reviewed and shows no acute changes.  Would start to taper Keppra.  Recommendations: 1. D/C IV Keppra 2. Give po Keppra  BID for 2 doses then discontinue 3. Continue seizure precautions 4. Patient unable to drive, operate heavy machinery, perform activities at heights and participate in water activities until release by outpatient physician.   LOS: 2 days   Thana Farr, MD Neurology [phone] 10/25/2018  10:45 AM

## 2018-10-25 NOTE — Progress Notes (Signed)
Called to bedside by nursing as pt is confused, trying to get out of bed and wanting to leave.  Pt is hemodynamically stable, no focal deficits noted, and pupils PERRLA.  She is redirectable and able to be reoriented, and is agreeable to staying tonight.  Previous imaging (CTH, MRI, EEG all negative).  No additional imaging at this time, continue to assess.    Darel Hong, AGACNP-BC [facility] Pager: [phone]

## 2018-10-25 NOTE — Progress Notes (Signed)
[facility] [place] [facility]   PATIENT NAME: Jenna Nelson    MR#:  [id number]  DATE OF BIRTH:  01-03-1961  SUBJECTIVE:   Patient awake alert oriented times two eating lunch. Of IV insulin drip. Overall feels a lot better. NO Witness seizures REVIEW OF SYSTEMS:   Review of Systems  Constitutional: Negative for chills, fever and weight loss.  HENT: Negative for ear discharge, ear pain and nosebleeds.   Eyes: Negative for blurred vision, pain and discharge.  Respiratory: Negative for sputum production, shortness of breath, wheezing and stridor.   Cardiovascular: Negative for chest pain, palpitations, orthopnea and PND.  Gastrointestinal: Negative for abdominal pain, diarrhea, nausea and vomiting.  Genitourinary: Negative for frequency and urgency.  Musculoskeletal: Negative for back pain and joint pain.  Neurological: Negative for sensory change, speech change, focal weakness and weakness.  Psychiatric/Behavioral: Negative for depression and hallucinations. The patient is not nervous/anxious.    Tolerating Diet:yes Tolerating PT:   DRUG ALLERGIES:   Allergies  Allergen Reactions  . Codeine   . Sulfa Antibiotics     VITALS:  Blood pressure (!) 161/111, pulse 95, temperature 98.4 F (36.9 C), temperature source Oral, resp. rate 17, height 5\' 1"  (1.549 m), weight 77.1 kg, SpO2 97 %.  PHYSICAL EXAMINATION:   Physical Exam   GENERAL:  58 y.o.-year-old patient lying in the bed with no acute distress.  EYES: eyeballs rolled towards the right. HEENT: Head atraumatic, normocephalic. Oropharynx and nasopharynx clear.  NECK:  Supple, no jugular venous distention. No thyroid enlargement, no tenderness.  LUNGS: Normal breath sounds bilaterally, no wheezing, rales, rhonchi. No use of accessory muscles of respiration.  CARDIOVASCULAR: S1, S2 normal. No murmurs, rubs, or gallops.  ABDOMEN: Soft, nontender, nondistended. Bowel sounds present. No  organomegaly or mass.  EXTREMITIES: No cyanosis, clubbing or edema b/l.    NEUROLOGIC: cranial nerves intact. Grossly motor function normal. PSYCHIATRIC:  patient is awake alert oriented times three  SKIN: No obvious rash, lesion, or ulcer.   LABORATORY PANEL:  CBC Recent Labs  Lab 10/25/18 0428  WBC 12.0*  HGB 13.2  HCT 40.9  PLT 150    Chemistries  Recent Labs  Lab 10/23/18 1433  10/25/18 0428  NA 132*   < > 143  K 4.2   < > 3.4*  CL 96*   < > 114*  CO2 11*   < > 17*  GLUCOSE 454*   < > 126*  BUN 17   < > 14  CREATININE 0.81   < > 0.66  CALCIUM 8.5*   < > 8.1*  MG  --    < > 2.3  AST 40  --   --   ALT 34  --   --   ALKPHOS 89  --   --   BILITOT 2.4*  --   --    < > = values in this interval not displayed.   Cardiac Enzymes No results for input(s): TROPONINI in the last 168 hours. RADIOLOGY:  Dg Chest 1 View  Result Date: 10/23/2018 CLINICAL DATA:  Leukocytosis EXAM: CHEST  1 VIEW COMPARISON:  04/17/2017 FINDINGS: Lungs are clear.  No pleural effusion or pneumothorax. The heart is top-normal in size. IMPRESSION: No evidence of acute cardiopulmonary disease. Electronically Signed   By: 04/19/2017 M.D.   On: 10/23/2018 15:55   Ct Head Wo Contrast  Result Date: 10/24/2018 CLINICAL DATA:  Speech difficulty EXAM: CT HEAD WITHOUT CONTRAST TECHNIQUE: Contiguous  axial images were obtained from the base of the skull through the vertex without intravenous contrast. COMPARISON:  None. FINDINGS: Brain: No evidence of acute infarction, hemorrhage, hydrocephalus, extra-axial collection or mass lesion/mass effect. Vascular: No hyperdense vessel or unexpected calcification. Skull: Normal. Negative for fracture or focal lesion. Sinuses/Orbits: No acute finding. IMPRESSION: Negative head CT. Electronically Signed   By: Monte Fantasia M.D.   On: 10/24/2018 04:35   Mr Jeri Cos WN Contrast  Result Date: 10/24/2018 CLINICAL DATA:  Seizure. Eye deviation and unresponsiveness.  DKA. EXAM: MRI HEAD WITHOUT AND WITH CONTRAST TECHNIQUE: Multiplanar, multiecho pulse sequences of the brain and surrounding structures were obtained without and with intravenous contrast. CONTRAST:  24mL GADAVIST GADOBUTROL 1 MMOL/ML IV SOLN COMPARISON:  Head CT 10/24/2018 FINDINGS: Brain: There is no evidence of acute infarct, intracranial hemorrhage, mass, midline shift, or extra-axial fluid collection. The ventricles and sulci are within normal limits for age. T2 hyperintensities in the cerebral white matter are nonspecific but compatible with mild chronic small vessel ischemic disease. No abnormal enhancement is identified. Dedicated temporal lobe imaging is mildly motion degraded with grossly symmetric hippocampal size and no gross signal abnormality. Hippocampal sulcus remnant cysts are incidentally noted bilaterally. Vascular: Major intracranial vascular flow voids are preserved. Skull and upper cervical spine: Unremarkable bone marrow signal. Sinuses/Orbits: Unremarkable orbits. Subcentimeter right maxillary sinus mucous retention cyst. Clear mastoid air cells. Other: None. IMPRESSION: 1. No acute intracranial abnormality. 2. Mild chronic small vessel ischemic disease. Electronically Signed   By: Logan Bores M.D.   On: 10/24/2018 17:19   Ct Abdomen Pelvis W Contrast  Result Date: 10/23/2018 CLINICAL DATA:  58 year old female with abdominal pain and dizziness. EXAM: CT ABDOMEN AND PELVIS WITH CONTRAST TECHNIQUE: Multidetector CT imaging of the abdomen and pelvis was performed using the standard protocol following bolus administration of intravenous contrast. CONTRAST:  172mL OMNIPAQUE IOHEXOL 300 MG/ML  SOLN COMPARISON:  CT of the abdomen pelvis report dated 05/03/2007. The images are not available for direct comparison. FINDINGS: Lower chest: The visualized lung bases are clear. No intra-abdominal free air or free fluid. Hepatobiliary: There is diffuse fatty infiltration of the liver. No intrahepatic  biliary ductal dilatation. Cholecystectomy. No retained calcified stone noted in the central CBD. Pancreas: Unremarkable. No pancreatic ductal dilatation or surrounding inflammatory changes. Spleen: Normal in size without focal abnormality. Adrenals/Urinary Tract: The adrenal glands are unremarkable. There is no hydronephrosis on either side. Faint area of hypoenhancement in the upper pole of the right (series 2, image 27 and coronal series 5, image 66) as well as additional hypoenhancing areas in the lower pole and interpolar right kidney noted which are not well evaluated but may represent pyelonephritis. Correlation with urinalysis recommended. The visualized ureters and urinary bladder appear unremarkable. Stomach/Bowel: There is a small hiatal hernia. Minimal thickened appearance of the distal esophagus which may be related to underdistention or represent mild esophagitis. Clinical correlation is recommended. There is no bowel obstruction. The appendix is normal. Vascular/Lymphatic: The abdominal aorta and IVC are unremarkable. No portal venous gas. There is no adenopathy. Reproductive: Hysterectomy. No adnexal masses. Other: None Musculoskeletal: No acute or significant osseous findings. IMPRESSION: 1. Findings concerning for right-sided pyelonephritis. Correlation with urinalysis recommended. 2. No bowel obstruction or active inflammation. Normal appendix. 3. Fatty liver. 4. Small hiatal hernia. Underdistention versus mild distal esophagitis. Clinical correlation is recommended. Electronically Signed   By: Anner Crete M.D.   On: 10/23/2018 16:58   ASSESSMENT AND PLAN:   58 year old female patient  with a known history of hypertension, type 2 diabetes mellitus presented to the emergency room for nausea vomiting and abdominal discomfort.  Patient also has been dizzy for the last couple of days.  She was having polyuria polyphagia and excessive thirst.  *Diabetic ketoacidosis -AG normal -CO 2  15--17 -pt now off IV insulin drip-- Lantus 10 units daily -patient takes metformin at home.  *Sepsis secondary to pyelonephritis -Patient on broad-spectrum antibiotics Vanco and Zosyn--IV rocephin Follow-up blood cultures negative and taper antibiotics  *Altered mental status/encephalopathy/possible provoke seizures -by neurology -started on IV Keppra, EEG normal  -per neurology taper Keppra 250 mg bid with 2 doses  *Dehydration in the setting of DKA Received IV fluids  *DVT prophylaxis subcu Lovenox daily  CODE STATUS: full  DVT Prophylaxis: lovneox  TOTAL TIME TAKING CARE OF THIS PATIENT: *30* minutes.  >50% time spent on counselling and coordination of care  POSSIBLE D/C IN 1-2 DAYS, DEPENDING ON CLINICAL CONDITION.  Note: This dictation was prepared with Dragon dictation along with smaller phrase technology. Any transcriptional errors that result from this process are unintentional.  Enedina FinnerSona Athony Coppa M.D on 10/25/2018 at 2:03 PM  Between 7am to 6pm - Pager - [phone]  After 6pm go to [email or web address] - Social research officer, governmentpassword [email or web address] [facility]  [facility]  Office  [phone]  CC: Primary care physician; Rodrigo RanPerini, Mark, MDPatient ID: Josephina GipLuanne T Zayas, female   DOB: 12/20/1960, 58 y.o.   MRN: [id number]

## 2018-10-25 NOTE — Progress Notes (Signed)
Inpatient Diabetes Program Recommendations  AACE/ADA: New Consensus Statement on Inpatient Glycemic Control  Target Ranges:  Prepandial:   less than 140 mg/dL      Peak postprandial:   less than 180 mg/dL (1-2 hours)      Critically ill patients:  140 - 180 mg/dL  Results for KORRIN, WATERFIELD (MRN [id number]) as of 10/25/2018 12:03  Ref. Range 10/24/2018 07:51 10/24/2018 11:26 10/24/2018 15:53 10/24/2018 20:08 10/25/2018 00:15 10/25/2018 04:47 10/25/2018 07:36 10/25/2018 11:54  Glucose-Capillary Latest Ref Range: 70 - 99 mg/dL 749 (H) 449 (H) 675 (H) 128 (H) 104 (H) 117 (H) 118 (H) 169 (H)   Results for SYMPHANY, FLEISSNER (MRN [id number]) as of 10/25/2018 12:03  Ref. Range 10/23/2018 20:21  Hemoglobin A1C Latest Ref Range: 4.8 - 5.6 % 11.2 (H)    Review of Glycemic Control  Diabetes history: DM2 Outpatient Diabetes medications: Soliqua 60 units QAM, Metformin 1000 mg BID Current orders for Inpatient glycemic control: Lantus 10 units QHS, Novolog 0-15 units Q4H  Inpatient Diabetes Program Recommendations:   HbgA1C:  A1C 11.2% on 10/23/18 indicating an average glucose of 275 mg/dl over the past 2-3 months.  NOTE: Patient alert and oriented. Lying in bed with eyes open and engaged in conversation. Spoke with patient about diabetes and home regimen for diabetes control. Patient reports being followed by PCP for diabetes management and currently taking Soliqua 60 units QAM and Metformin 1000 mg BID as an outpatient for diabetes control. Patient states that she is not absolutley certain about the name of the insulin but thinks it is [place].  Patient states that she normally takes DM medications as prescribed but states over the past few days prior to admission, she was not feeling well and did not take her DM medications. Patient reports that she checks her glucose once a day and when she was taking DM meds as prescribed her glucose was usually in the 200's mg/dl in the morning (only checks once a  day).  Patient reports that her PCP has talked with her about potentially using an insulin pump in the near future and she has an appointment this coming Thursday with Olegario Messier, CDE in the PCP office to discuss possibility of an insulin pump.  Discussed insulin pumps and explained how they work.  Encouraged patient to discuss further with the Olegario Messier, CDE at PCP office.  Patient reports last A1C was 10.6%. Discussed A1C results (11.2% on 10/23/18 ) and explained that current A1C indicates an average glucose of 275 mg/dl over the past 2-3 months. Discussed glucose and A1C goals. Discussed importance of checking CBGs and maintaining good CBG control to prevent long-term and short-term complications. Patient states that she does not know what symptoms are typical of hypoglycemia nor hyperglycemia. Discussed hypoglycemia and hyperglycemia, along with signs and symptoms, and treatment for each.  Explained how hyperglycemia leads to damage within blood vessels which lead to the common complications seen with uncontrolled diabetes. Stressed to the patient the importance of improving glycemic control to prevent further complications from uncontrolled diabetes. Discussed impact of nutrition, exercise, stress, sickness, and medications on diabetes control.  Patient states she would like to talk with RD to further discuss Carb Modified diet and to get some ideas about healthy low carb snacks. Ordered RD consult.  Informed patient that a Living Well with DM book would be ordered to provided more information about DM management.  Encouraged patient to check glucose 3-4 times per day (before meals and at bedtime) and to  keep a log book of glucose readings and DM medication taken which patient will need to take to doctor appointments. Explained how the log book can be used to continue to make adjustments with DM medications if needed.  Patient verbalized understanding of information discussed and reports no further questions at this  time related to diabetes. Called PCP office and left message for Dr. Silvestre Mesi CMA to return call to clarify outpatient DM medications. Also called [facility] in [place] to inquire about insulin and was informed patient is prescribed Soliqua 32 units daily and it was last filled on September 13, 2018.  Thanks, Barnie Alderman, RN, MSN, CDE Diabetes Coordinator Inpatient Diabetes Program [phone] (Team Pager)

## 2018-10-26 DIAGNOSIS — R569 Unspecified convulsions: Secondary | ICD-10-CM | POA: Diagnosis not present

## 2018-10-26 DIAGNOSIS — A419 Sepsis, unspecified organism: Secondary | ICD-10-CM | POA: Diagnosis not present

## 2018-10-26 DIAGNOSIS — E1110 Type 2 diabetes mellitus with ketoacidosis without coma: Secondary | ICD-10-CM | POA: Diagnosis not present

## 2018-10-26 DIAGNOSIS — E860 Dehydration: Secondary | ICD-10-CM | POA: Diagnosis not present

## 2018-10-26 DIAGNOSIS — N12 Tubulo-interstitial nephritis, not specified as acute or chronic: Secondary | ICD-10-CM | POA: Diagnosis not present

## 2018-10-26 LAB — CBC
HCT: 39.6 % (ref 36.0–46.0)
Hemoglobin: 12.7 g/dL (ref 12.0–15.0)
MCH: 29.6 pg (ref 26.0–34.0)
MCHC: 32.1 g/dL (ref 30.0–36.0)
MCV: 92.3 fL (ref 80.0–100.0)
Platelets: 128 10*3/uL — ABNORMAL LOW (ref 150–400)
RBC: 4.29 MIL/uL (ref 3.87–5.11)
RDW: 13.9 % (ref 11.5–15.5)
WBC: 11.3 10*3/uL — ABNORMAL HIGH (ref 4.0–10.5)
nRBC: 0 % (ref 0.0–0.2)

## 2018-10-26 LAB — BASIC METABOLIC PANEL
Anion gap: 11 (ref 5–15)
BUN: 18 mg/dL (ref 6–20)
CO2: 18 mmol/L — ABNORMAL LOW (ref 22–32)
Calcium: 8.3 mg/dL — ABNORMAL LOW (ref 8.9–10.3)
Chloride: 115 mmol/L — ABNORMAL HIGH (ref 98–111)
Creatinine, Ser: 0.65 mg/dL (ref 0.44–1.00)
GFR calc Af Amer: >60 mL/min (ref >60)
GFR calc non Af Amer: >60 mL/min (ref >60)
Glucose, Bld: 115 mg/dL — ABNORMAL HIGH (ref 70–99)
Potassium: 3.7 mmol/L (ref 3.5–5.1)
Sodium: 144 mmol/L (ref 135–145)

## 2018-10-26 LAB — GLUCOSE, CAPILLARY
Glucose-Capillary: 102 mg/dL — ABNORMAL HIGH (ref 70–99)
Glucose-Capillary: 111 mg/dL — ABNORMAL HIGH (ref 70–99)
Glucose-Capillary: 119 mg/dL — ABNORMAL HIGH (ref 70–99)
Glucose-Capillary: 141 mg/dL — ABNORMAL HIGH (ref 70–99)
Glucose-Capillary: 80 mg/dL (ref 70–99)
Glucose-Capillary: 84 mg/dL (ref 70–99)

## 2018-10-26 MED ORDER — AMLODIPINE BESYLATE 10 MG PO TABS
10.0000 mg | ORAL_TABLET | Freq: Every day | ORAL | Status: DC
  Administered 2018-10-27 – 2018-10-28 (×2): 10 mg via ORAL
  Filled 2018-10-26 (×2): qty 1

## 2018-10-26 MED ORDER — HYDROCHLOROTHIAZIDE 25 MG PO TABS
25.0000 mg | ORAL_TABLET | Freq: Every day | ORAL | Status: DC
  Administered 2018-10-27 – 2018-10-28 (×2): 25 mg via ORAL
  Filled 2018-10-26 (×2): qty 1

## 2018-10-26 MED ORDER — METFORMIN HCL 500 MG PO TABS
500.0000 mg | ORAL_TABLET | Freq: Two times a day (BID) | ORAL | Status: DC
  Administered 2018-10-26 – 2018-10-28 (×5): 500 mg via ORAL
  Filled 2018-10-26 (×5): qty 1

## 2018-10-26 MED ORDER — ARIPIPRAZOLE 5 MG PO TABS
5.0000 mg | ORAL_TABLET | Freq: Every day | ORAL | Status: DC
  Administered 2018-10-26 – 2018-10-28 (×3): 5 mg via ORAL
  Filled 2018-10-26 (×3): qty 1

## 2018-10-26 MED ORDER — FLUOXETINE HCL 20 MG PO CAPS
40.0000 mg | ORAL_CAPSULE | Freq: Every day | ORAL | Status: DC
  Administered 2018-10-26 – 2018-10-28 (×3): 40 mg via ORAL
  Filled 2018-10-26 (×3): qty 2

## 2018-10-26 MED ORDER — CEPHALEXIN 500 MG PO CAPS
500.0000 mg | ORAL_CAPSULE | Freq: Three times a day (TID) | ORAL | Status: DC
  Administered 2018-10-26 – 2018-10-27 (×2): 500 mg via ORAL
  Filled 2018-10-26 (×2): qty 1

## 2018-10-26 MED ORDER — AMLODIPINE-VALSARTAN-HCTZ 10-320-25 MG PO TABS
1.0000 | ORAL_TABLET | Freq: Every day | ORAL | Status: DC
Start: 2018-10-26 — End: 2018-10-26

## 2018-10-26 MED ORDER — ONDANSETRON HCL 4 MG/2ML IJ SOLN
4.0000 mg | Freq: Four times a day (QID) | INTRAMUSCULAR | Status: DC | PRN
  Administered 2018-10-26: 16:00:00 4 mg via INTRAVENOUS
  Filled 2018-10-26: qty 2

## 2018-10-26 MED ORDER — LOSARTAN POTASSIUM 50 MG PO TABS
100.0000 mg | ORAL_TABLET | Freq: Every day | ORAL | Status: DC
  Administered 2018-10-27 – 2018-10-28 (×2): 100 mg via ORAL
  Filled 2018-10-26 (×2): qty 2

## 2018-10-26 MED ORDER — PANTOPRAZOLE SODIUM 40 MG PO TBEC
40.0000 mg | DELAYED_RELEASE_TABLET | Freq: Every day | ORAL | Status: DC
  Administered 2018-10-27 – 2018-10-28 (×2): 40 mg via ORAL
  Filled 2018-10-26 (×2): qty 1

## 2018-10-26 NOTE — Progress Notes (Addendum)
Subjective: Patient with some confusion overnight.  This morning again seemed unresponsive to sternal rub.  Eyes closed but opened them when oculocephalic maneuver continued.  Hands raised actively and slowly returned to her side volitionally.  Withdrew to stroking her soles bilaterally.    Objective: Current vital signs: BP (!) 145/68 (BP Location: Left Arm)   Pulse 61   Temp 97.7 F (36.5 C) (Axillary)   Resp 12   Ht 5\' 1"  (1.549 m)   Wt 77.1 kg   SpO2 98%   BMI 32.12 kg/m  Vital signs in last 24 hours: Temp:  [97.7 F (36.5 C)-98.4 F (36.9 C)] 97.7 F (36.5 C) (10/21 0800) Pulse Rate:  [53-107] 61 (10/21 0800) Resp:  [12-31] 12 (10/21 0800) BP: (120-161)/(60-111) 145/68 (10/21 0800) SpO2:  [96 %-98 %] 98 % (10/21 0800)  Intake/Output from previous day: 10/20 0701 - 10/21 0700 In: 320 [P.O.:120; IV Piggyback:200] Out: 350 [Urine:350] Intake/Output this shift: Total I/O In: -  Out: 300 [Urine:300] Nutritional status:  Diet Order            Diet heart healthy/carb modified Room service appropriate? Yes; Fluid consistency: Thin  Diet effective now              Neurologic Exam: Mental Status: Unresponsive.  Eyes closed but lids fluttering.  Follows simple commands.   Cranial Nerves: II: Pupils equal, round, reactive to light and accommodation III,IV, VI: No response with oculocephalic maneuvers V,VII: Intact corneals bilaterally VIII: unable to test IX,X: unable to test XI: unable to test XII: unable to test Motor: Normal tone in upper extremities.  Able to maintain when lifted.  Withdraws with lower extremities.     Lab Results: Basic Metabolic Panel: Recent Labs  Lab 10/23/18 1433 10/23/18 2021 10/24/18 0604 10/25/18 0428 10/26/18 0428  NA 132* 135 137 143 144  K 4.2 4.1 3.8 3.4* 3.7  CL 96* 106 111 114* 115*  CO2 11* 15* 15* 17* 18*  GLUCOSE 454* 202* 212* 126* 115*  BUN 17 13 9 14 18   CREATININE 0.81 0.71  0.77 0.66 0.66 0.65  CALCIUM 8.5*  8.3* 7.9* 8.1* 8.3*  MG  --   --  2.1 2.3  --   PHOS  --   --  1.8* 2.5  --     Liver Function Tests: Recent Labs  Lab 10/23/18 1433  AST 40  ALT 34  ALKPHOS 89  BILITOT 2.4*  PROT 7.6  ALBUMIN 4.2   Recent Labs  Lab 10/23/18 1433  LIPASE 12   No results for input(s): AMMONIA in the last 168 hours.  CBC: Recent Labs  Lab 10/23/18 1433 10/23/18 2021 10/24/18 0604 10/25/18 0428 10/26/18 0428  WBC 24.3* 20.3* 16.2* 12.0* 11.3*  NEUTROABS  --   --   --  9.2*  --   HGB 14.6 13.8 13.4 13.2 12.7  HCT 43.3 41.5 40.6 40.9 39.6  MCV 89.1 87.9 89.4 90.9 92.3  PLT 300 249 190 150 128*    Cardiac Enzymes: No results for input(s): CKTOTAL, CKMB, CKMBINDEX, TROPONINI in the last 168 hours.  Lipid Panel: No results for input(s): CHOL, TRIG, HDL, CHOLHDL, VLDL, LDLCALC in the last 168 hours.  CBG: Recent Labs  Lab 10/25/18 1558 10/25/18 1950 10/25/18 2354 10/26/18 0427 10/26/18 0729  GLUCAP 127* 99 109* 102* 111*    Microbiology: Results for orders placed or performed during the hospital encounter of 10/23/18  SARS Coronavirus 2 by RT PCR (hospital order, performed  in [facility] hospital lab) Nasopharyngeal Nasopharyngeal Swab     Status: None   Collection Time: 10/23/18  3:54 PM   Specimen: Nasopharyngeal Swab  Result Value Ref Range Status   SARS Coronavirus 2 NEGATIVE NEGATIVE Final    Comment: (NOTE) If result is NEGATIVE SARS-CoV-2 target nucleic acids are NOT DETECTED. The SARS-CoV-2 RNA is generally detectable in upper and lower  respiratory specimens during the acute phase of infection. The lowest  concentration of SARS-CoV-2 viral copies this assay can detect is 250  copies / mL. A negative result does not preclude SARS-CoV-2 infection  and should not be used as the sole basis for treatment or other  patient management decisions.  A negative result may occur with  improper specimen collection / handling, submission of specimen other  than  nasopharyngeal swab, presence of viral mutation(s) within the  areas targeted by this assay, and inadequate number of viral copies  (<250 copies / mL). A negative result must be combined with clinical  observations, patient history, and epidemiological information. If result is POSITIVE SARS-CoV-2 target nucleic acids are DETECTED. The SARS-CoV-2 RNA is generally detectable in upper and lower  respiratory specimens dur ing the acute phase of infection.  Positive  results are indicative of active infection with SARS-CoV-2.  Clinical  correlation with patient history and other diagnostic information is  necessary to determine patient infection status.  Positive results do  not rule out bacterial infection or co-infection with other viruses. If result is PRESUMPTIVE POSTIVE SARS-CoV-2 nucleic acids MAY BE PRESENT.   A presumptive positive result was obtained on the submitted specimen  and confirmed on repeat testing.  While 2019 novel coronavirus  (SARS-CoV-2) nucleic acids may be present in the submitted sample  additional confirmatory testing may be necessary for epidemiological  and / or clinical management purposes  to differentiate between  SARS-CoV-2 and other Sarbecovirus currently known to infect humans.  If clinically indicated additional testing with an alternate test  methodology [phone]) is advised. The SARS-CoV-2 RNA is generally  detectable in upper and lower respiratory sp ecimens during the acute  phase of infection. The expected result is Negative. Fact Sheet for Patients:  BoilerBrush.com.cy Fact Sheet for Healthcare Providers: https://pope.com/ This test is not yet approved or cleared by the [place] FDA and has been authorized for detection and/or diagnosis of SARS-CoV-2 by FDA under an Emergency Use Authorization (EUA).  This EUA will remain in effect (meaning this test can be used) for the duration of  the COVID-19 declaration under Section 564(b)(1) of the Act, 21 U.S.C. section 360bbb-3(b)(1), unless the authorization is terminated or revoked sooner. Performed at [facility], [address] [place], [place] [address]   Blood culture (routine x 2)     Status: None (Preliminary result)   Collection Time: 10/23/18  6:03 PM   Specimen: BLOOD  Result Value Ref Range Status   Specimen Description BLOOD R WRIST  Final   Special Requests   Final    BOTTLES DRAWN AEROBIC AND ANAEROBIC Blood Culture adequate volume   Culture   Final    NO GROWTH 3 DAYS Performed at [facility], [address] [place], [place] [address]    Report Status PENDING  Incomplete  Blood culture (routine x 2)     Status: None (Preliminary result)   Collection Time: 10/23/18  6:03 PM   Specimen: BLOOD  Result Value Ref Range Status   Specimen Description BLOOD R AC  Final  Special Requests   Final    BOTTLES DRAWN AEROBIC AND ANAEROBIC Blood Culture results may not be optimal due to an excessive volume of blood received in culture bottles   Culture   Final    NO GROWTH 3 DAYS Performed at [facility], [address], [place], [place] [address]    Report Status PENDING  Incomplete  MRSA PCR Screening     Status: None   Collection Time: 10/23/18  8:33 PM   Specimen: Nasopharyngeal  Result Value Ref Range Status   MRSA by PCR NEGATIVE NEGATIVE Final    Comment:        The GeneXpert MRSA Assay (FDA approved for NASAL specimens only), is one component of a comprehensive MRSA colonization surveillance program. It is not intended to diagnose MRSA infection nor to guide or monitor treatment for MRSA infections. Performed at [facility], [address] [place], [place] [address]     Coagulation Studies: No results for input(s): LABPROT, INR in the last 72 hours.  Imaging: Mr Jeri Cos QI Contrast  Result Date: 10/24/2018 CLINICAL DATA:  Seizure. Eye  deviation and unresponsiveness. DKA. EXAM: MRI HEAD WITHOUT AND WITH CONTRAST TECHNIQUE: Multiplanar, multiecho pulse sequences of the brain and surrounding structures were obtained without and with intravenous contrast. CONTRAST:  46mL GADAVIST GADOBUTROL 1 MMOL/ML IV SOLN COMPARISON:  Head CT 10/24/2018 FINDINGS: Brain: There is no evidence of acute infarct, intracranial hemorrhage, mass, midline shift, or extra-axial fluid collection. The ventricles and sulci are within normal limits for age. T2 hyperintensities in the cerebral white matter are nonspecific but compatible with mild chronic small vessel ischemic disease. No abnormal enhancement is identified. Dedicated temporal lobe imaging is mildly motion degraded with grossly symmetric hippocampal size and no gross signal abnormality. Hippocampal sulcus remnant cysts are incidentally noted bilaterally. Vascular: Major intracranial vascular flow voids are preserved. Skull and upper cervical spine: Unremarkable bone marrow signal. Sinuses/Orbits: Unremarkable orbits. Subcentimeter right maxillary sinus mucous retention cyst. Clear mastoid air cells. Other: None. IMPRESSION: 1. No acute intracranial abnormality. 2. Mild chronic small vessel ischemic disease. Electronically Signed   By: Logan Bores M.D.   On: 10/24/2018 17:19    Medications:  I have reviewed the patient's current medications. Scheduled: . amLODipine  10 mg Oral Daily  . Chlorhexidine Gluconate Cloth  6 each Topical Daily  . enoxaparin (LOVENOX) injection  40 mg Subcutaneous Q24H  . insulin aspart  0-15 Units Subcutaneous Q4H  . insulin glargine  10 Units Subcutaneous QHS  . living well with diabetes book   Does not apply Once    Assessment/Plan: 58 year old female with seizure like activity.  Patient now back to baseline.  Keppra was started and continued at a maintenance of 500mg  BID.  Yesterday decreased to 250mg  BID.  EEG with a normal background.  MRI of the brain reviewed and  shows no acute changes.  Reported on yesterday that she is only taking about 3 Xanax a week.  Bottles not available for corroboration.  Episodes have multiple features that suggest nonepileptic origin of events.    Recommendations: 1. Continue off antiepileptic medications for now 2. Continue seizure precautions 3. Patient unable to drive, operate heavy machinery, perform activities at heights and participate in water activities until release by outpatient physician.   LOS: 3 days   Alexis Goodell, MD Neurology [phone] 10/26/2018  10:09 AM

## 2018-10-26 NOTE — Progress Notes (Signed)
[facility] for Electrolyte Monitoring and Replacement   Recent Labs: Potassium (mmol/L)  Date Value  10/26/2018 3.7   Magnesium (mg/dL)  Date Value  10/25/2018 2.3   Calcium (mg/dL)  Date Value  10/26/2018 8.3 (L)   Albumin (g/dL)  Date Value  10/23/2018 4.2   Phosphorus (mg/dL)  Date Value  10/25/2018 2.5   Sodium (mmol/L)  Date Value  10/26/2018 144     Assessment: 58 year old female admitted with DKA. Patient had a possible seizure activity on 10/19 and was given Keppra. Patient is awake and alert, with no episodes of unresponsiveness. Can transition back to oral medications since patient is not lethargic nor NPO.     Goal of Therapy:  Electrolytes WNL  Plan:  No replenishment is needed at this time Will recheck electrolytes with am labs   Sallye Lat ,PharmD Candidate 10/26/2018 11:33 AM

## 2018-10-26 NOTE — Progress Notes (Signed)
[facility] NAME: Jenna Nelson    MR#:  [id number]  DATE OF BIRTH:  Jun 18, 1960  SUBJECTIVE:   Patient intermittently gets confused. Currently awake alert answer most of my questions appropriately.  Off IV insulin drip. Overall feels a lot better. NO Witness seizures REVIEW OF SYSTEMS:   Review of Systems  Constitutional: Negative for chills, fever and weight loss.  HENT: Negative for ear discharge, ear pain and nosebleeds.   Eyes: Negative for blurred vision, pain and discharge.  Respiratory: Negative for sputum production, shortness of breath, wheezing and stridor.   Cardiovascular: Negative for chest pain, palpitations, orthopnea and PND.  Gastrointestinal: Negative for abdominal pain, diarrhea, nausea and vomiting.  Genitourinary: Negative for frequency and urgency.  Musculoskeletal: Negative for back pain and joint pain.  Neurological: Negative for sensory change, speech change, focal weakness and weakness.  Psychiatric/Behavioral: Negative for depression and hallucinations. The patient is not nervous/anxious.    Tolerating Diet:yes Tolerating PT: pending  DRUG ALLERGIES:   Allergies  Allergen Reactions  . Codeine   . Sulfa Antibiotics     VITALS:  Blood pressure (!) 149/70, pulse 86, temperature 97.7 F (36.5 C), temperature source Axillary, resp. rate 16, height 5\' 1"  (1.549 m), weight 77.1 kg, SpO2 96 %.  PHYSICAL EXAMINATION:   Physical Exam   GENERAL:  58 y.o.-year-old patient lying in the bed with no acute distress.  EYES: eyeballs rolled towards the right. HEENT: Head atraumatic, normocephalic. Oropharynx and nasopharynx clear.  NECK:  Supple, no jugular venous distention. No thyroid enlargement, no tenderness.  LUNGS: Normal breath sounds bilaterally, no wheezing, rales, rhonchi. No use of accessory muscles of respiration.  CARDIOVASCULAR: S1, S2 normal. No murmurs, rubs, or gallops.  ABDOMEN:  Soft, nontender, nondistended. Bowel sounds present. No organomegaly or mass.  EXTREMITIES: No cyanosis, clubbing or edema b/l.    NEUROLOGIC: cranial nerves intact. Grossly motor function normal. PSYCHIATRIC:  patient is awake alert oriented times 2  SKIN: No obvious rash, lesion, or ulcer.   LABORATORY PANEL:  CBC Recent Labs  Lab 10/26/18 0428  WBC 11.3*  HGB 12.7  HCT 39.6  PLT 128*    Chemistries  Recent Labs  Lab 10/23/18 1433  10/25/18 0428 10/26/18 0428  NA 132*   < > 143 144  K 4.2   < > 3.4* 3.7  CL 96*   < > 114* 115*  CO2 11*   < > 17* 18*  GLUCOSE 454*   < > 126* 115*  BUN 17   < > 14 18  CREATININE 0.81   < > 0.66 0.65  CALCIUM 8.5*   < > 8.1* 8.3*  MG  --    < > 2.3  --   AST 40  --   --   --   ALT 34  --   --   --   ALKPHOS 89  --   --   --   BILITOT 2.4*  --   --   --    < > = values in this interval not displayed.   Cardiac Enzymes No results for input(s): TROPONINI in the last 168 hours. RADIOLOGY:  Mr Jeri Cos OJ Contrast  Result Date: 10/24/2018 CLINICAL DATA:  Seizure. Eye deviation and unresponsiveness. DKA. EXAM: MRI HEAD WITHOUT AND WITH CONTRAST TECHNIQUE: Multiplanar, multiecho pulse sequences of the brain and surrounding structures were obtained without and with intravenous contrast. CONTRAST:  6mL GADAVIST GADOBUTROL  1 MMOL/ML IV SOLN COMPARISON:  Head CT 10/24/2018 FINDINGS: Brain: There is no evidence of acute infarct, intracranial hemorrhage, mass, midline shift, or extra-axial fluid collection. The ventricles and sulci are within normal limits for age. T2 hyperintensities in the cerebral white matter are nonspecific but compatible with mild chronic small vessel ischemic disease. No abnormal enhancement is identified. Dedicated temporal lobe imaging is mildly motion degraded with grossly symmetric hippocampal size and no gross signal abnormality. Hippocampal sulcus remnant cysts are incidentally noted bilaterally. Vascular: Major intracranial  vascular flow voids are preserved. Skull and upper cervical spine: Unremarkable bone marrow signal. Sinuses/Orbits: Unremarkable orbits. Subcentimeter right maxillary sinus mucous retention cyst. Clear mastoid air cells. Other: None. IMPRESSION: 1. No acute intracranial abnormality. 2. Mild chronic small vessel ischemic disease. Electronically Signed   By: Sebastian Ache M.D.   On: 10/24/2018 17:19   ASSESSMENT AND PLAN:   58 year old female patient with a known history of hypertension, type 2 diabetes mellitus presented to the emergency room for nausea vomiting and abdominal discomfort.  Patient also has been dizzy for the last couple of days.  She was having polyuria polyphagia and excessive thirst.  *Diabetic ketoacidosis -AG normal -CO 2 15--17 -pt now off IV insulin drip-- Lantus 10 units daily--will change her to metformin at home patient sugars are very well controlled   *Sepsis secondary to pyelonephritis -Patient on broad-spectrum antibiotics Vanco and Zosyn--IV rocephin-- change to PO Keflex. Follow-up blood cultures negative and taper antibiotics  *Altered mental status/encephalopathy/possible provoke seizures -by neurology -started on IV Keppra, EEG normal ,per neurology taper Keppra 250 mg bid with 2 doses-- patient of anti-seizure meds  *Dehydration in the setting of DKA Received IV fluids  *DVT prophylaxis subcu Lovenox daily  *Hypertension resume BP meds -not sure why patient is on spironolactone holding it  *Will resume abilify and Prozac also if continues to show improvement discharged home tomorrow  case manager for discharge planning  CODE STATUS: full  DVT Prophylaxis: lovneox  TOTAL TIME TAKING CARE OF THIS PATIENT: *30* minutes.  >50% time spent on counselling and coordination of care  POSSIBLE D/C IN 1-2 DAYS, DEPENDING ON CLINICAL CONDITION.  Note: This dictation was prepared with Dragon dictation along with smaller phrase technology. Any  transcriptional errors that result from this process are unintentional.  Enedina Finner M.D on 10/26/2018 at 3:36 PM  Between 7am to 6pm - Pager - [phone]  After 6pm go to [email or web address] - Social research officer, government  [facility]  Office  [phone]  CC: Primary care physician; Rodrigo Ran, MDPatient ID: Josephina Gip, female   DOB: 03-01-1960, 58 y.o.   MRN: [id number]

## 2018-10-27 DIAGNOSIS — A419 Sepsis, unspecified organism: Secondary | ICD-10-CM | POA: Diagnosis not present

## 2018-10-27 DIAGNOSIS — E1110 Type 2 diabetes mellitus with ketoacidosis without coma: Secondary | ICD-10-CM | POA: Diagnosis not present

## 2018-10-27 DIAGNOSIS — N12 Tubulo-interstitial nephritis, not specified as acute or chronic: Secondary | ICD-10-CM | POA: Diagnosis not present

## 2018-10-27 DIAGNOSIS — E860 Dehydration: Secondary | ICD-10-CM | POA: Diagnosis not present

## 2018-10-27 DIAGNOSIS — R569 Unspecified convulsions: Secondary | ICD-10-CM | POA: Diagnosis not present

## 2018-10-27 LAB — GLUCOSE, CAPILLARY
Glucose-Capillary: 91 mg/dL (ref 70–99)
Glucose-Capillary: 130 mg/dL — ABNORMAL HIGH (ref 70–99)
Glucose-Capillary: 166 mg/dL — ABNORMAL HIGH (ref 70–99)
Glucose-Capillary: 222 mg/dL — ABNORMAL HIGH (ref 70–99)
Glucose-Capillary: 130 mg/dL — ABNORMAL HIGH (ref 70–99)
Glucose-Capillary: 76 mg/dL (ref 70–99)

## 2018-10-27 LAB — CBC
HCT: 40.7 % (ref 36.0–46.0)
Hemoglobin: 13.1 g/dL (ref 12.0–15.0)
MCH: 29.3 pg (ref 26.0–34.0)
MCHC: 32.2 g/dL (ref 30.0–36.0)
MCV: 91.1 fL (ref 80.0–100.0)
Platelets: 141 10*3/uL — ABNORMAL LOW (ref 150–400)
RBC: 4.47 MIL/uL (ref 3.87–5.11)
RDW: 13.2 % (ref 11.5–15.5)
WBC: 10.5 10*3/uL (ref 4.0–10.5)
nRBC: 0 % (ref 0.0–0.2)

## 2018-10-27 LAB — BASIC METABOLIC PANEL
Anion gap: 12 (ref 5–15)
BUN: 20 mg/dL (ref 6–20)
CO2: 17 mmol/L — ABNORMAL LOW (ref 22–32)
Calcium: 8.4 mg/dL — ABNORMAL LOW (ref 8.9–10.3)
Chloride: 114 mmol/L — ABNORMAL HIGH (ref 98–111)
Creatinine, Ser: 0.65 mg/dL (ref 0.44–1.00)
GFR calc Af Amer: >60 mL/min (ref >60)
GFR calc non Af Amer: >60 mL/min (ref >60)
Glucose, Bld: 121 mg/dL — ABNORMAL HIGH (ref 70–99)
Potassium: 3.7 mmol/L (ref 3.5–5.1)
Sodium: 143 mmol/L (ref 135–145)

## 2018-10-27 NOTE — Progress Notes (Signed)
[facility] [place] [facility]   PATIENT NAME: Jenna Nelson    MR#:  [id number]  DATE OF BIRTH:  March 28, 1960  SUBJECTIVE:   Feels well. Blood sugars well controlled. No seizures  REVIEW OF SYSTEMS:   Review of Systems  Constitutional: Negative for chills, fever and weight loss.  HENT: Negative for ear discharge, ear pain and nosebleeds.   Eyes: Negative for blurred vision, pain and discharge.  Respiratory: Negative for sputum production, shortness of breath, wheezing and stridor.   Cardiovascular: Negative for chest pain, palpitations, orthopnea and PND.  Gastrointestinal: Negative for abdominal pain, diarrhea, nausea and vomiting.  Genitourinary: Negative for frequency and urgency.  Musculoskeletal: Negative for back pain and joint pain.  Neurological: Negative for sensory change, speech change, focal weakness and weakness.  Psychiatric/Behavioral: Negative for depression and hallucinations. The patient is not nervous/anxious.    Tolerating Diet:yes Tolerating PT: yes  DRUG ALLERGIES:   Allergies  Allergen Reactions  . Codeine   . Sulfa Antibiotics     VITALS:  Blood pressure 140/76, pulse 93, temperature 97.7 F (36.5 C), temperature source Oral, resp. rate 19, height 5\' 1"  (1.549 m), weight 77.1 kg, SpO2 98 %.  PHYSICAL EXAMINATION:   Physical Exam   GENERAL:  58 y.o.-year-old patient lying in the bed with no acute distress.  EYES: eyeballs rolled towards the right. HEENT: Head atraumatic, normocephalic. Oropharynx and nasopharynx clear.  NECK:  Supple, no jugular venous distention. No thyroid enlargement, no tenderness.  LUNGS: Normal breath sounds bilaterally, no wheezing, rales, rhonchi. No use of accessory muscles of respiration.  CARDIOVASCULAR: S1, S2 normal. No murmurs, rubs, or gallops.  ABDOMEN: Soft, nontender, nondistended. Bowel sounds present. No organomegaly or mass.  EXTREMITIES: No cyanosis, clubbing or edema b/l.     NEUROLOGIC: cranial nerves intact. Grossly motor function normal. PSYCHIATRIC:  patient is awake alert oriented x 3 SKIN: No obvious rash, lesion, or ulcer.   LABORATORY PANEL:  CBC Recent Labs  Lab 10/27/18 0451  WBC 10.5  HGB 13.1  HCT 40.7  PLT 141*    Chemistries  Recent Labs  Lab 10/23/18 1433  10/25/18 0428  10/27/18 0451  NA 132*   < > 143   < > 143  K 4.2   < > 3.4*   < > 3.7  CL 96*   < > 114*   < > 114*  CO2 11*   < > 17*   < > 17*  GLUCOSE 454*   < > 126*   < > 121*  BUN 17   < > 14   < > 20  CREATININE 0.81   < > 0.66   < > 0.65  CALCIUM 8.5*   < > 8.1*   < > 8.4*  MG  --    < > 2.3  --   --   AST 40  --   --   --   --   ALT 34  --   --   --   --   ALKPHOS 89  --   --   --   --   BILITOT 2.4*  --   --   --   --    < > = values in this interval not displayed.   Cardiac Enzymes No results for input(s): TROPONINI in the last 168 hours. RADIOLOGY:  No results found. ASSESSMENT AND PLAN:   58 year old female patient with a known history of hypertension, type 2  diabetes mellitus presented to the emergency room for nausea vomiting and abdominal discomfort.  Patient also has been dizzy for the last couple of days.  She was having polyuria polyphagia and excessive thirst.  * Uncontrolled DM with DKA due to prednisone and missing insulin We have not started her home insulin yet. Will see how blood sugars are overnight and d/c home tomorrow  * Pyelonephritis on CT . But UA normal. -Patient on broad-spectrum antibiotics Vanco and Zosyn--IV rocephin-- changed to PO Keflex. Will stop abx today Blood and urine cx negative  *Altered mental status/encephalopathy/possible provoke seizures -by neurology -started on IV Keppra, EEG normal ,per neurology taper Keppra 250 mg bid with 2 doses-- patient of anti-seizure meds  *Dehydration in the setting of DKA Received IV fluids  *DVT prophylaxis subcu Lovenox daily  *Hypertension resume BP meds -not sure why  patient is on spironolactone holding it  *Will resume abilify and Prozac also if continues to show improvement discharged home tomorrow  case manager for discharge planning  CODE STATUS: full  DVT Prophylaxis: lovneox  TOTAL TIME TAKING CARE OF THIS PATIENT: *30* minutes.   POSSIBLE D/C IN 1-2 DAYS, DEPENDING ON CLINICAL CONDITION.  Note: This dictation was prepared with Dragon dictation along with smaller phrase technology. Any transcriptional errors that result from this process are unintentional.  Jenna Nelson M.D on 10/27/2018 at 4:38 PM  Between 7am to 6pm - Pager - [phone]  After 6pm go to [email or web address] - Proofreader  [facility]  Office  [phone]  CC: Primary care physician; Jenna Nelson, MDPatient ID: Jenna Nelson, female   DOB: 12/23/1960, 58 y.o.   MRN: [id number]

## 2018-10-27 NOTE — Evaluation (Signed)
Physical Therapy Evaluation Patient Details Name: Jenna Nelson MRN: [id number] DOB: [birth year] Today's Date: 10/27/2018   History of Present Illness  Pt is a 58 y.o. female presenting to hospital 10/18 with near syncope, dizziness, N/V, abdominal discomfort, generalized weakness, and increased thirst.  Pt admitted with diabetic ketoacidosis and sepsis secondary pyelonephritis.  Pt noted with seizure like activity 10/19 and decreased responsiveness.  PMH includes small hiatal hernia, DM, and htn.  Clinical Impression  Prior to hospital admission, pt was independent and driving.  Pt lives alone in 1 level home with 6 steps to enter with railings.  Currently pt is modified independent semi-supine to sit; CGA with transfers (initial posterior lean standing from bed but not noted rest of session with transfers); and pt progressed to CGA with ambulation around nursing loop no AD use (pt initially very unsteady requiring min to mod assist for balance with walking but with increased repetition of ambulation during session pt's balance improved to CGA--see gait below for details).  No c/o pain during session.  A&O x4.  Pt would benefit from skilled PT to address noted impairments and functional limitations during hospitalization (see below for any additional details).  Anticipate with continued ambulation during hospital stay, pt's balance and ambulation will continue to improve and pt will not need further PT follow up after hospital discharge.    Follow Up Recommendations No PT follow up    Equipment Recommendations  None recommended by PT    Recommendations for Other Services       Precautions / Restrictions Precautions Precautions: Fall Restrictions Weight Bearing Restrictions: No      Mobility  Bed Mobility Overal bed mobility: Modified Independent             General bed mobility comments: Semi-supine to sit without any noted difficulties  Transfers Overall transfer level:  Needs assistance Equipment used: None Transfers: Sit to/from Stand;Stand Pivot Transfers Sit to Stand: Min guard Stand pivot transfers: Min guard       General transfer comment: fairly strong stand from bed (x2 trials), from toilet (x1 trial), and from recliner (x2 trials); posterior lean noted 1st trial standing but no further balance issues noted with standing after that  Ambulation/Gait Ambulation/Gait assistance: Min guard;Min assist;Mod assist Gait Distance (Feet): (10 feet x2 (to/from bathroom); 20 feet (in room); 180 feet x2) Assistive device: None   Gait velocity: mildly decreased   General Gait Details: pt very unsteady ambulating to bathroom requiring min to mod assist for balance but min assist ambulating back from bathroom (increased B lateral sway and loss of balance laterally requiring assist to regain balance); pt CGA to min assist ambulating short distance in room; CGA to min assist ambulating in hallway 1st trial (intermittent mild lateral loss of balance looking around); CGA ambulating in hallway 2nd trial (very occasional mild loss of balance laterally when looking around but pt able to self correct); vc's to increase BOS with ambulation as well as mildly increasing gait speed to simulate baseline gait speed  Stairs            Wheelchair Mobility    Modified Rankin (Stroke Patients Only)       Balance Overall balance assessment: Needs assistance Sitting-balance support: No upper extremity supported;Feet supported Sitting balance-Leahy Scale: Normal Sitting balance - Comments: steady sitting reaching outside BOS   Standing balance support: No upper extremity supported Standing balance-Leahy Scale: Good Standing balance comment: steady standing washing hands at sink  Pertinent Vitals/Pain   Vitals (HR and O2 on room air) stable and WFL throughout treatment session.    Home Living Family/patient expects to be  discharged to:: Private residence Living Arrangements: Alone   Type of Home: House Home Access: Stairs to enter Entrance Stairs-Rails: Doctor, general practice of Steps: 6 Home Layout: One level        Prior Function Level of Independence: Independent         Comments: (+) driving; retired; pt reports no falls in past 6 months     Hand Dominance        Extremity/Trunk Assessment   Upper Extremity Assessment Upper Extremity Assessment: Generalized weakness    Lower Extremity Assessment Lower Extremity Assessment: Generalized weakness    Cervical / Trunk Assessment Cervical / Trunk Assessment: Normal  Communication   Communication: No difficulties  Cognition Arousal/Alertness: Awake/alert Behavior During Therapy: WFL for tasks assessed/performed Overall Cognitive Status: Within Functional Limits for tasks assessed                                 General Comments: Oriented to person, place, time, and situation      General Comments   Nursing cleared pt for participation in physical therapy.  Pt agreeable to PT session and requesting to walk to the bathroom.    Exercises  Transfer and gait training   Assessment/Plan    PT Assessment Patient needs continued PT services  PT Problem List Decreased strength;Decreased activity tolerance;Decreased balance;Decreased mobility;Decreased knowledge of use of DME;Decreased knowledge of precautions       PT Treatment Interventions DME instruction;Gait training;Stair training;Functional mobility training;Therapeutic activities;Therapeutic exercise;Balance training;Patient/family education    PT Goals (Current goals can be found in the Care Plan section)  Acute Rehab PT Goals Patient Stated Goal: to go home and improve walking PT Goal Formulation: With patient Time For Goal Achievement: 11/10/18 Potential to Achieve Goals: Good    Frequency Min 2X/week   Barriers to discharge         Co-evaluation               AM-PAC PT "6 Clicks" Mobility  Outcome Measure Help needed turning from your back to your side while in a flat bed without using bedrails?: None Help needed moving from lying on your back to sitting on the side of a flat bed without using bedrails?: None Help needed moving to and from a bed to a chair (including a wheelchair)?: A Little Help needed standing up from a chair using your arms (e.g., wheelchair or bedside chair)?: A Little Help needed to walk in hospital room?: A Little Help needed climbing 3-5 steps with a railing? : A Little 6 Click Score: 20    End of Session Equipment Utilized During Treatment: Gait belt Activity Tolerance: Patient tolerated treatment well Patient left: in chair;with call bell/phone within reach;with chair alarm set Nurse Communication: Mobility status;Precautions PT Visit Diagnosis: Unsteadiness on feet (R26.81);Muscle weakness (generalized) (M62.81);Difficulty in walking, not elsewhere classified (R26.2)    Time: 6967-8938 PT Time Calculation (min) (ACUTE ONLY): 31 min   Charges:   PT Evaluation $PT Eval Low Complexity: 1 Low PT Treatments $Gait Training: 8-22 mins        Hendricks Limes, PT 10/27/18, 3:40 PM [phone]

## 2018-10-27 NOTE — Progress Notes (Signed)
Subjective: Patient awake and alert.  No further episodes of altered awareness since yesterday morning.    Objective: Current vital signs: BP (!) 160/71 (BP Location: Left Arm)   Pulse 85   Temp 98 F (36.7 C) (Oral)   Resp 18   Ht 5\' 1"  (1.549 m)   Wt 77.1 kg   SpO2 100%   BMI 32.12 kg/m  Vital signs in last 24 hours: Temp:  [98 F (36.7 C)-98.2 F (36.8 C)] 98 F (36.7 C) (10/22 0926) Pulse Rate:  [69-101] 85 (10/22 0926) Resp:  [16-18] 18 (10/22 0926) BP: (132-164)/(70-80) 160/71 (10/22 0926) SpO2:  [95 %-100 %] 100 % (10/22 0926)  Intake/Output from previous day: 10/21 0701 - 10/22 0700 In: 99.9 [IV Piggyback:99.9] Out: 1000 [Urine:1000] Intake/Output this shift: Total I/O In: 240 [P.O.:240] Out: -  Nutritional status:  Diet Order            Diet heart healthy/carb modified Room service appropriate? Yes; Fluid consistency: Thin  Diet effective now              Neurologic Exam: Mental Status: Alert, oriented, thought content appropriate.  Speech fluent without evidence of aphasia.  Able to follow 3 step commands without difficulty. Cranial Nerves: II: Discs flat bilaterally; Visual fields grossly normal, pupils equal, round, reactive to light and accommodation III,IV, VI: ptosis not present, extra-ocular motions intact bilaterally V,VII: smile symmetric, facial light touch sensation normal bilaterally VIII: hearing normal bilaterally IX,X: gag reflex present XI: bilateral shoulder shrug XII: midline tongue extension Motor: 5/5 throughout Sensory: Pinprick and light touch intact throughout, bilaterally   Lab Results: Basic Metabolic Panel: Recent Labs  Lab 10/23/18 2021 10/24/18 0604 10/25/18 0428 10/26/18 0428 10/27/18 0451  NA 135 137 143 144 143  K 4.1 3.8 3.4* 3.7 3.7  CL 106 111 114* 115* 114*  CO2 15* 15* 17* 18* 17*  GLUCOSE 202* 212* 126* 115* 121*  BUN 13 9 14 18 20   CREATININE 0.71  0.77 0.66 0.66 0.65 0.65  CALCIUM 8.3* 7.9*  8.1* 8.3* 8.4*  MG  --  2.1 2.3  --   --   PHOS  --  1.8* 2.5  --   --     Liver Function Tests: Recent Labs  Lab 10/23/18 1433  AST 40  ALT 34  ALKPHOS 89  BILITOT 2.4*  PROT 7.6  ALBUMIN 4.2   Recent Labs  Lab 10/23/18 1433  LIPASE 12   No results for input(s): AMMONIA in the last 168 hours.  CBC: Recent Labs  Lab 10/23/18 2021 10/24/18 0604 10/25/18 0428 10/26/18 0428 10/27/18 0451  WBC 20.3* 16.2* 12.0* 11.3* 10.5  NEUTROABS  --   --  9.2*  --   --   HGB 13.8 13.4 13.2 12.7 13.1  HCT 41.5 40.6 40.9 39.6 40.7  MCV 87.9 89.4 90.9 92.3 91.1  PLT 249 190 150 128* 141*    Cardiac Enzymes: No results for input(s): CKTOTAL, CKMB, CKMBINDEX, TROPONINI in the last 168 hours.  Lipid Panel: No results for input(s): CHOL, TRIG, HDL, CHOLHDL, VLDL, LDLCALC in the last 168 hours.  CBG: Recent Labs  Lab 10/26/18 1957 10/26/18 2357 10/27/18 0413 10/27/18 0756 10/27/18 1155  GLUCAP 84 80 91 130* 166*    Microbiology: Results for orders placed or performed during the hospital encounter of 10/23/18  SARS Coronavirus 2 by RT PCR (hospital order, performed in [facility] hospital lab) Nasopharyngeal Nasopharyngeal Swab     Status: None  Collection Time: 10/23/18  3:54 PM   Specimen: Nasopharyngeal Swab  Result Value Ref Range Status   SARS Coronavirus 2 NEGATIVE NEGATIVE Final    Comment: (NOTE) If result is NEGATIVE SARS-CoV-2 target nucleic acids are NOT DETECTED. The SARS-CoV-2 RNA is generally detectable in upper and lower  respiratory specimens during the acute phase of infection. The lowest  concentration of SARS-CoV-2 viral copies this assay can detect is 250  copies / mL. A negative result does not preclude SARS-CoV-2 infection  and should not be used as the sole basis for treatment or other  patient management decisions.  A negative result may occur with  improper specimen collection / handling, submission of specimen other  than nasopharyngeal swab,  presence of viral mutation(s) within the  areas targeted by this assay, and inadequate number of viral copies  (<250 copies / mL). A negative result must be combined with clinical  observations, patient history, and epidemiological information. If result is POSITIVE SARS-CoV-2 target nucleic acids are DETECTED. The SARS-CoV-2 RNA is generally detectable in upper and lower  respiratory specimens dur ing the acute phase of infection.  Positive  results are indicative of active infection with SARS-CoV-2.  Clinical  correlation with patient history and other diagnostic information is  necessary to determine patient infection status.  Positive results do  not rule out bacterial infection or co-infection with other viruses. If result is PRESUMPTIVE POSTIVE SARS-CoV-2 nucleic acids MAY BE PRESENT.   A presumptive positive result was obtained on the submitted specimen  and confirmed on repeat testing.  While 2019 novel coronavirus  (SARS-CoV-2) nucleic acids may be present in the submitted sample  additional confirmatory testing may be necessary for epidemiological  and / or clinical management purposes  to differentiate between  SARS-CoV-2 and other Sarbecovirus currently known to infect humans.  If clinically indicated additional testing with an alternate test  methodology [phone]) is advised. The SARS-CoV-2 RNA is generally  detectable in upper and lower respiratory sp ecimens during the acute  phase of infection. The expected result is Negative. Fact Sheet for Patients:  StrictlyIdeas.no Fact Sheet for Healthcare Providers: BankingDealers.co.za This test is not yet approved or cleared by the [place] FDA and has been authorized for detection and/or diagnosis of SARS-CoV-2 by FDA under an Emergency Use Authorization (EUA).  This EUA will remain in effect (meaning this test can be used) for the duration of the COVID-19 declaration under  Section 564(b)(1) of the Act, 21 U.S.C. section 360bbb-3(b)(1), unless the authorization is terminated or revoked sooner. Performed at [facility], [address] [place], [place] [address]   Blood culture (routine x 2)     Status: None (Preliminary result)   Collection Time: 10/23/18  6:03 PM   Specimen: BLOOD  Result Value Ref Range Status   Specimen Description BLOOD R WRIST  Final   Special Requests   Final    BOTTLES DRAWN AEROBIC AND ANAEROBIC Blood Culture adequate volume   Culture   Final    NO GROWTH 4 DAYS Performed at [facility], [address] [place], [place] [address]    Report Status PENDING  Incomplete  Blood culture (routine x 2)     Status: None (Preliminary result)   Collection Time: 10/23/18  6:03 PM   Specimen: BLOOD  Result Value Ref Range Status   Specimen Description BLOOD R AC  Final   Special Requests   Final    BOTTLES DRAWN AEROBIC AND ANAEROBIC Blood  Culture results may not be optimal due to an excessive volume of blood received in culture bottles   Culture   Final    NO GROWTH 4 DAYS Performed at [facility], [address] [place], [place] [address]    Report Status PENDING  Incomplete  MRSA PCR Screening     Status: None   Collection Time: 10/23/18  8:33 PM   Specimen: Nasopharyngeal  Result Value Ref Range Status   MRSA by PCR NEGATIVE NEGATIVE Final    Comment:        The GeneXpert MRSA Assay (FDA approved for NASAL specimens only), is one component of a comprehensive MRSA colonization surveillance program. It is not intended to diagnose MRSA infection nor to guide or monitor treatment for MRSA infections. Performed at [facility], [address] [place], [place] [address]     Coagulation Studies: No results for input(s): LABPROT, INR in the last 72 hours.  Imaging: No results found.  Medications:  I have reviewed the patient's current medications. Scheduled: . amLODipine  10  mg Oral Daily   And  . losartan  100 mg Oral Daily   And  . hydrochlorothiazide  25 mg Oral Daily  . ARIPiprazole  5 mg Oral Daily  . cephALEXin  500 mg Oral Q8H  . Chlorhexidine Gluconate Cloth  6 each Topical Daily  . enoxaparin (LOVENOX) injection  40 mg Subcutaneous Q24H  . FLUoxetine  40 mg Oral Daily  . insulin aspart  0-15 Units Subcutaneous Q4H  . living well with diabetes book   Does not apply Once  . metFORMIN  500 mg Oral BID WC  . pantoprazole  40 mg Oral Daily    Assessment/Plan: 58 year old female with DKA and seizure like activity. Patient now back to baseline. Keppra was started but now discontinued.  No further events since yesterday.    Recommendations: 1. Remains off anticonvulsants.   2. Continue seizure precautions 3. Patient unable to drive, operate heavy machinery, perform activities at heights and participate in water activities until release by outpatient physician.   LOS: 4 days   Thana FarrLeslie Chenika Nevils, MD Neurology [phone] 10/27/2018  12:00 PM

## 2018-10-27 NOTE — Progress Notes (Signed)
[place] for Electrolyte Monitoring and Replacement   Recent Labs: Potassium (mmol/L)  Date Value  10/27/2018 3.7   Magnesium (mg/dL)  Date Value  10/25/2018 2.3   Calcium (mg/dL)  Date Value  10/27/2018 8.4 (L)   Albumin (g/dL)  Date Value  10/23/2018 4.2   Phosphorus (mg/dL)  Date Value  10/25/2018 2.5   Sodium (mmol/L)  Date Value  10/27/2018 143     Assessment: 58 year old female admitted with DKA. Patient had a possible seizure activity on 10/19 and was given Keppra. Patient is awake and alert, with no episodes of unresponsiveness. Can transition back to oral medications since patient is not lethargic nor NPO.     Goal of Therapy:  Electrolytes WNL  Plan:  No replenishment is needed at this timex2  Pharmacy will sign off at this time.   Lu Duffel, PharmD, BCPS Clinical Pharmacist 10/27/2018 7:24 AM

## 2018-10-28 DIAGNOSIS — N12 Tubulo-interstitial nephritis, not specified as acute or chronic: Secondary | ICD-10-CM | POA: Diagnosis not present

## 2018-10-28 DIAGNOSIS — E860 Dehydration: Secondary | ICD-10-CM | POA: Diagnosis not present

## 2018-10-28 DIAGNOSIS — A419 Sepsis, unspecified organism: Secondary | ICD-10-CM | POA: Diagnosis not present

## 2018-10-28 DIAGNOSIS — E1110 Type 2 diabetes mellitus with ketoacidosis without coma: Secondary | ICD-10-CM | POA: Diagnosis not present

## 2018-10-28 LAB — CULTURE, BLOOD (ROUTINE X 2)
Culture: NO GROWTH
Culture: NO GROWTH
Special Requests: ADEQUATE

## 2018-10-28 LAB — CBC
HCT: 38.3 % (ref 36.0–46.0)
Hemoglobin: 12.7 g/dL (ref 12.0–15.0)
MCH: 29.3 pg (ref 26.0–34.0)
MCHC: 33.2 g/dL (ref 30.0–36.0)
MCV: 88.2 fL (ref 80.0–100.0)
Platelets: 155 10*3/uL (ref 150–400)
RBC: 4.34 MIL/uL (ref 3.87–5.11)
RDW: 13.3 % (ref 11.5–15.5)
WBC: 11.1 10*3/uL — ABNORMAL HIGH (ref 4.0–10.5)
nRBC: 0 % (ref 0.0–0.2)

## 2018-10-28 LAB — GLUCOSE, CAPILLARY
Glucose-Capillary: 109 mg/dL — ABNORMAL HIGH (ref 70–99)
Glucose-Capillary: 147 mg/dL — ABNORMAL HIGH (ref 70–99)
Glucose-Capillary: 155 mg/dL — ABNORMAL HIGH (ref 70–99)
Glucose-Capillary: 162 mg/dL — ABNORMAL HIGH (ref 70–99)

## 2018-10-28 NOTE — TOC Initial Note (Signed)
Transition of Care [facility]) - Initial/Assessment Note    Patient Details  Name: Jenna Nelson MRN: [id number] Date of Birth: 1960-02-11  Transition of Care [facility] CM/SW Contact:    Norina Buzzard, RN Phone Number: 10/28/2018, 12:32 PM  Clinical Narrative: Met with pt at bedside. She lives alone. She plans to return home with the support of her brother who is very supportive. Pt has been evaluated by PT and no recommendations for PT f/u or DME. She reports that she has medical insurance and she is able to afford her medications. She denies any D/C needs.               Expected Discharge Plan: Home/Self Care     Patient Goals and CMS Choice Patient states their goals for this hospitalization and ongoing recovery are:: to get better      Expected Discharge Plan and Services Expected Discharge Plan: Home/Self Care   Discharge Planning Services: CM Consult   Living arrangements for the past 2 months: Single Family Home Expected Discharge Date: 10/28/18                                    Prior Living Arrangements/Services Living arrangements for the past 2 months: Single Family Home Lives with:: Self Patient language and need for interpreter reviewed:: Yes Do you feel safe going back to the place where you live?: Yes      Need for Family Participation in Patient Care: Yes (Comment) Care giver support system in place?: Yes (comment)   Criminal Activity/Legal Involvement Pertinent to Current Situation/Hospitalization: No - Comment as needed  Activities of Daily Living      Permission Sought/Granted Permission sought to share information with : Case Manager                Emotional Assessment Appearance:: Appears stated age, Well-Groomed     Orientation: : Oriented to Self, Oriented to Place, Oriented to  Time, Oriented to Situation, Fluctuating Orientation (Suspected and/or reported [place])   Psych Involvement: No (comment)  Admission  diagnosis:  SIRS (systemic inflammatory response syndrome) ([place]) [R65.10] Diabetic ketoacidosis without coma associated with type 2 diabetes mellitus ([place]) [E11.10] Patient Active Problem List   Diagnosis Date Noted  . Hypertension associated with diabetes ([place])   . DKA (diabetic ketoacidoses) ([place]) 10/23/2018   PCP:  Crist Infante, MD Pharmacy:   [id number] [place], [id number]: [phone]: [phone]    Social Determinants of Health (SDOH) Interventions    Readmission Risk Interventions No flowsheet data found.

## 2018-10-28 NOTE — Progress Notes (Signed)
Physical Therapy Treatment Patient Details Name: Jenna Nelson MRN: [id number] DOB: [birth year] Today's Date: 10/28/2018    History of Present Illness Pt is a 58 y.o. female presenting to hospital 10/18 with near syncope, dizziness, N/V, abdominal discomfort, generalized weakness, and increased thirst.  Pt admitted with diabetic ketoacidosis and sepsis secondary pyelonephritis.  Pt noted with seizure like activity 10/19 and decreased responsiveness.  PMH includes small hiatal hernia, DM, and htn.    PT Comments    Pt reporting walking last night with nursing staff.  Pt modified independent with bed mobility; independent with transfers; SBA with ambulation 2 laps around nursing station (no AD); and SBA navigating 6 steps with B railings.  Occasional mild altered stepping pattern to side when looking around initially (while walking) but pt able to self correct and pt's gait/balance improved with increased distance ambulating.  Will continue to focus on higher level balance during hospital stay but anticipate no PT follow-up needs on hospital discharge.   Follow Up Recommendations  No PT follow up     Equipment Recommendations  None recommended by PT    Recommendations for Other Services       Precautions / Restrictions Precautions Precautions: Fall Restrictions Weight Bearing Restrictions: No    Mobility  Bed Mobility Overal bed mobility: Modified Independent             General bed mobility comments: Semi-supine to sit without any noted difficulties  Transfers Overall transfer level: Independent Equipment used: None Transfers: Sit to/from [employer] to Stand: Independent Stand pivot transfers: Independent       General transfer comment: steady safe transfers noted  Ambulation/Gait Ambulation/Gait assistance: Supervision Gait Distance (Feet): 390 Feet Assistive device: None   Gait velocity: mildly decreased   General Gait Details: step  through gait pattern; occasional mild altered stepping pattern to side when looking around initially but pt able to self correct and improved with increased distance ambulating   Stairs Stairs: Yes Stairs assistance: Supervision Stair Management: One rail Right;One rail Left;Alternating pattern;Forwards Number of Stairs: 6 General stair comments: steady safe stairs navigation noted   Wheelchair Mobility    Modified Rankin (Stroke Patients Only)       Balance Overall balance assessment: Needs assistance Sitting-balance support: No upper extremity supported;Feet supported Sitting balance-Leahy Scale: Normal Sitting balance - Comments: steady sitting reaching outside BOS   Standing balance support: No upper extremity supported Standing balance-Leahy Scale: Good Standing balance comment: steady standing reaching within BOS                            Cognition Arousal/Alertness: Awake/alert Behavior During Therapy: WFL for tasks assessed/performed Overall Cognitive Status: Within Functional Limits for tasks assessed                                 General Comments: Oriented to person, place, time, and situation      Exercises      General Comments  Pt agreeable to PT session.      Pertinent Vitals/Pain Pain Assessment: No/denies pain  Vitals (HR and O2 on room air) stable and WFL throughout treatment session.    Home Living                      Prior Function            PT  Goals (current goals can now be found in the care plan section) Acute Rehab PT Goals Patient Stated Goal: to go home and improve walking PT Goal Formulation: With patient Time For Goal Achievement: 11/10/18 Potential to Achieve Goals: Good Progress towards PT goals: Progressing toward goals    Frequency    Min 2X/week      PT Plan Current plan remains appropriate    Co-evaluation              AM-PAC PT "6 Clicks" Mobility   Outcome  Measure  Help needed turning from your back to your side while in a flat bed without using bedrails?: None Help needed moving from lying on your back to sitting on the side of a flat bed without using bedrails?: None Help needed moving to and from a bed to a chair (including a wheelchair)?: None Help needed standing up from a chair using your arms (e.g., wheelchair or bedside chair)?: None Help needed to walk in hospital room?: None Help needed climbing 3-5 steps with a railing? : A Little 6 Click Score: 23    End of Session Equipment Utilized During Treatment: Gait belt Activity Tolerance: Patient tolerated treatment well Patient left: in chair;with call bell/phone within reach;with chair alarm set Nurse Communication: Mobility status;Precautions(via white board) PT Visit Diagnosis: Unsteadiness on feet (R26.81);Muscle weakness (generalized) (M62.81);Difficulty in walking, not elsewhere classified (R26.2)     Time: 9735-3299 PT Time Calculation (min) (ACUTE ONLY): 12 min  Charges:  $Gait Training: 8-22 mins                     Leitha Bleak, PT 10/28/18, 11:26 AM [phone]

## 2018-10-28 NOTE — Progress Notes (Signed)
Per day shift nurse, paperwork and everything for patient's discharge is completed. Pt is only waiting on brother to pick her up. Pt brother came and pt left with brother at 68. No signs of distress noted at the time of discharge.

## 2018-10-28 NOTE — Discharge Instructions (Signed)
Do not drive, operate heavy machinery, perform activities at heights and participate in water activities until release by outpatient physician.  Please check blood sugars 4 times a day - Before meals and at bedtime. Keep log and take to your doctor's appointment.  Reduce insulin to 15 units daily.

## 2018-11-01 NOTE — Discharge Summary (Signed)
[facility] NAME: Jenna Nelson    MR#:  [id number]  DATE OF BIRTH:  21-Apr-1960  DATE OF ADMISSION:  10/23/2018 ADMITTING PHYSICIAN: Saundra Shelling, MD  DATE OF DISCHARGE: 10/28/2018  7:35 PM  PRIMARY CARE PHYSICIAN: Crist Infante, MD   ADMISSION DIAGNOSIS:  SIRS (systemic inflammatory response syndrome) (HCC) [R65.10] Diabetic ketoacidosis without coma associated with type 2 diabetes mellitus ([place]) [E11.10]  DISCHARGE DIAGNOSIS:  Active Problems:   DKA (diabetic ketoacidoses) ([facility])   Hypertension associated with diabetes ([facility])   SECONDARY DIAGNOSIS:   Past Medical History:  Diagnosis Date  . Diabetes mellitus without complication ([place])   . Hypertension      ADMITTING HISTORY  HISTORY OF PRESENT ILLNESS: Jenna Nelson  is a 58 y.o. female with a known history of hypertension, type 2 diabetes mellitus presented to the emergency room for nausea vomiting and abdominal discomfort.  Patient also has been dizzy for the last couple of days.  Patient did not take diabetic medication for 1 week. She was having polyuria polyphagia and excessive thirst.  She has been tachycardic in the emergency room.  Initially her blood sugars were high bicarb was low and anion gap was elevated.  Patient was in DKA and started on IV insulin drip.  Her WBC count is elevated and patient appeared septic.  Patient also complains of right-sided flank pain aching in nature 6 out of 10 on a scale of 1-10.  She was worked up with CT abdomen which showed right-sided pyelonephritis.  Hospitalist service was consulted for further care.  HOSPITAL COURSE:   58 year old female patientwith a known history ofhypertension, type 2 diabetes mellitus presented to the emergency room for nausea vomiting and abdominal discomfort.Patient also has been dizzy for the last couple of days.She was having polyuria polyphagia and excessive thirst.  * Uncontrolled DM with DKA due  to prednisone and missing insulin Patient was started on Lantus 10 units in the hospital once DKA resolved.  Sliding scale insulin.  At the time of discharge I requested that she take only 15 units of her long-acting insulin and not 60 units like she was doing as her prednisone has stopped.  Check blood sugars 4 times a day and follow-up with primary care physician.  Patient verbalized understanding.  * Concern regarding pyelonephritis on CT . But UA normal. -Patient on broad-spectrum antibiotics Vanco and Zosyn--IV rocephin-- changed to PO Keflex. Blood and urine cx negative Stop antibiotics  *Altered mental status/encephalopathy/possible provoke seizures -by neurology -started on IV Keppra, EEG normal ,per neurology taper Keppra 250 mg bid with 2 doses-- patient off anti-seizure meds as per neurology recommendations Seizure precautions given at time of discharge  *Dehydration in the setting of DKA Received IV fluids and resolved  *DVT prophylaxis subcu Lovenox daily in the hospital  *Hypertension resume BP meds  Patient was discharged home in stable condition.  CONSULTS OBTAINED:  Treatment Team:  Catarina Hartshorn, MD  DRUG ALLERGIES:   Allergies  Allergen Reactions  . Codeine   . Sulfa Antibiotics     DISCHARGE MEDICATIONS:   Allergies as of 10/28/2018      Reactions   Codeine    Sulfa Antibiotics       Medication List    TAKE these medications   ALPRAZolam 1 MG tablet Commonly known as: XANAX Take 1 mg by mouth 3 (three) times daily as needed for anxiety.   amLODIPine-Valsartan-HCTZ 10-320-25 MG Tabs Take 1 tablet by  mouth daily.   ARIPiprazole 5 MG tablet Commonly known as: ABILIFY Take 5 mg by mouth daily.   esomeprazole 40 MG capsule Commonly known as: NEXIUM Take 40 mg by mouth 2 (two) times daily before a meal.   FLUoxetine 40 MG capsule Commonly known as: PROZAC Take 40 mg by mouth daily.   metFORMIN 500 MG 24 hr tablet Commonly known  as: GLUCOPHAGE-XR Take 1 tablet by mouth 2 (two) times daily with a meal.   omeprazole 20 MG capsule Commonly known as: PRILOSEC Take 20 mg by mouth 2 (two) times daily.   spironolactone 25 MG tablet Commonly known as: ALDACTONE Take 25 mg by mouth daily.   temazepam 30 MG capsule Commonly known as: RESTORIL Take 1 capsule by mouth at bedtime as needed for sleep.       Today   VITAL SIGNS:  Blood pressure (!) 143/84, pulse 96, temperature 97.7 F (36.5 C), temperature source Oral, resp. rate 20, height 5\' 1"  (1.549 m), weight 77.1 kg, SpO2 95 %.  I/O:  No intake or output data in the 24 hours ending 11/01/18 1907  PHYSICAL EXAMINATION:  Physical Exam  GENERAL:  58 y.o.-year-old patient lying in the bed with no acute distress.  LUNGS: Normal breath sounds bilaterally, no wheezing, rales,rhonchi or crepitation. No use of accessory muscles of respiration.  CARDIOVASCULAR: S1, S2 normal. No murmurs, rubs, or gallops.  ABDOMEN: Soft, non-tender, non-distended. Bowel sounds present. No organomegaly or mass.  NEUROLOGIC: Moves all 4 extremities. PSYCHIATRIC: The patient is alert and oriented x 3.  SKIN: No obvious rash, lesion, or ulcer.   DATA REVIEW:   CBC Recent Labs  Lab 10/28/18 0324  WBC 11.1*  HGB 12.7  HCT 38.3  PLT 155    Chemistries  Recent Labs  Lab 10/27/18 0451  NA 143  K 3.7  CL 114*  CO2 17*  GLUCOSE 121*  BUN 20  CREATININE 0.65  CALCIUM 8.4*    Cardiac Enzymes No results for input(s): TROPONINI in the last 168 hours.  Microbiology Results  Results for orders placed or performed during the hospital encounter of 10/23/18  SARS Coronavirus 2 by RT PCR (hospital order, [facility]) Nasopharyngeal Nasopharyngeal Swab     Status: None   Collection Time: 10/23/18  3:54 PM   Specimen: Nasopharyngeal Swab  Result Value Ref Range Status   SARS Coronavirus 2 NEGATIVE NEGATIVE Final    Comment: (NOTE) If result is  NEGATIVE SARS-CoV-2 target nucleic acids are NOT DETECTED. The SARS-CoV-2 RNA is generally detectable in upper and lower  respiratory specimens during the acute phase of infection. The lowest  concentration of SARS-CoV-2 viral copies this assay can detect is 250  copies / mL. A negative result does not preclude SARS-CoV-2 infection  and should not be used as the sole basis for treatment or other  patient management decisions.  A negative result may occur with  improper specimen collection / handling, submission of specimen other  than nasopharyngeal swab, presence of viral mutation(s) within the  areas targeted by this assay, and inadequate number of viral copies  (<250 copies / mL). A negative result must be combined with clinical  observations, patient history, and epidemiological information. If result is POSITIVE SARS-CoV-2 target nucleic acids are DETECTED. The SARS-CoV-2 RNA is generally detectable in upper and lower  respiratory specimens dur ing the acute phase of infection.  Positive  results are indicative of active infection with SARS-CoV-2.  Clinical  correlation with patient history and other diagnostic information is  necessary to determine patient infection status.  Positive results do  not rule out bacterial infection or co-infection with other viruses. If result is PRESUMPTIVE POSTIVE SARS-CoV-2 nucleic acids MAY BE PRESENT.   A presumptive positive result was obtained on the submitted specimen  and confirmed on repeat testing.  While 2019 novel coronavirus  (SARS-CoV-2) nucleic acids may be present in the submitted sample  additional confirmatory testing may be necessary for epidemiological  and / or clinical management purposes  to differentiate between  SARS-CoV-2 and other Sarbecovirus currently known to infect humans.  If clinically indicated additional testing with an alternate test  methodology [phone]) is advised. The SARS-CoV-2 RNA is generally  detectable  in upper and lower respiratory sp ecimens during the acute  phase of infection. The expected result is Negative. Fact Sheet for Patients:  BoilerBrush.com.cy Fact Sheet for Healthcare Providers: https://pope.com/ This test is not yet approved or cleared by the [place] FDA and has been authorized for detection and/or diagnosis of SARS-CoV-2 by FDA under an Emergency Use Authorization (EUA).  This EUA will remain in effect (meaning this test can be used) for the duration of the COVID-19 declaration under Section 564(b)(1) of the Act, 21 U.S.C. section 360bbb-3(b)(1), unless the authorization is terminated or revoked sooner. Performed at [facility], [address] [place], [place] [address]   Blood culture (routine x 2)     Status: None   Collection Time: 10/23/18  6:03 PM   Specimen: BLOOD  Result Value Ref Range Status   Specimen Description BLOOD R WRIST  Final   Special Requests   Final    BOTTLES DRAWN AEROBIC AND ANAEROBIC Blood Culture adequate volume   Culture   Final    NO GROWTH 5 DAYS Performed at [facility], [address] [place], [place] [address]    Report Status 10/28/2018 FINAL  Final  Blood culture (routine x 2)     Status: None   Collection Time: 10/23/18  6:03 PM   Specimen: BLOOD  Result Value Ref Range Status   Specimen Description BLOOD R AC  Final   Special Requests   Final    BOTTLES DRAWN AEROBIC AND ANAEROBIC Blood Culture results may not be optimal due to an excessive volume of blood received in culture bottles   Culture   Final    NO GROWTH 5 DAYS Performed at [facility], [address] [place], [place] [address]    Report Status 10/28/2018 FINAL  Final  MRSA PCR Screening     Status: None   Collection Time: 10/23/18  8:33 PM   Specimen: Nasopharyngeal  Result Value Ref Range Status   MRSA by PCR NEGATIVE NEGATIVE Final    Comment:        The GeneXpert  MRSA Assay (FDA approved for NASAL specimens only), is one component of a comprehensive MRSA colonization surveillance program. It is not intended to diagnose MRSA infection nor to guide or monitor treatment for MRSA infections. Performed at [facility], [address] [place], [place] [address]     RADIOLOGY:  No results found.  Follow up with PCP in 1 week.  Management plans discussed with the patient, family and they are in agreement.  CODE STATUS:  Code Status History    Date Active Date Inactive Code Status Order ID Comments User Context   10/23/2018 2006 10/28/2018 2246 Full Code [id number]  Ihor Austin,  MD ED   Advance Care Planning Activity      TOTAL TIME TAKING CARE OF THIS PATIENT ON DAY OF DISCHARGE: more than 30 minutes.   Molinda Bailiff Dimitri Shakespeare M.D on 11/01/2018 at 7:07 PM  Between 7am to 6pm - Pager - [phone]  After 6pm go to [email or web address] - password [email or web address]  [facility]  Office  [phone]  CC: Primary care physician; Rodrigo Ran, MD  Note: This dictation was prepared with Dragon dictation along with smaller phrase technology. Any transcriptional errors that result from this process are unintentional.

## 2018-12-06 DIAGNOSIS — E1169 Type 2 diabetes mellitus with other specified complication: Secondary | ICD-10-CM | POA: Diagnosis not present

## 2018-12-06 DIAGNOSIS — E7849 Other hyperlipidemia: Secondary | ICD-10-CM | POA: Diagnosis not present

## 2018-12-06 DIAGNOSIS — E038 Other specified hypothyroidism: Secondary | ICD-10-CM | POA: Diagnosis not present

## 2018-12-06 DIAGNOSIS — F329 Major depressive disorder, single episode, unspecified: Secondary | ICD-10-CM | POA: Diagnosis not present

## 2018-12-13 DIAGNOSIS — F329 Major depressive disorder, single episode, unspecified: Secondary | ICD-10-CM | POA: Diagnosis not present

## 2019-01-05 DIAGNOSIS — F329 Major depressive disorder, single episode, unspecified: Secondary | ICD-10-CM | POA: Diagnosis not present

## 2019-01-05 DIAGNOSIS — F418 Other specified anxiety disorders: Secondary | ICD-10-CM | POA: Diagnosis not present

## 2019-01-17 DIAGNOSIS — I10 Essential (primary) hypertension: Secondary | ICD-10-CM | POA: Diagnosis not present

## 2019-01-17 DIAGNOSIS — Z794 Long term (current) use of insulin: Secondary | ICD-10-CM | POA: Diagnosis not present

## 2019-01-17 DIAGNOSIS — E1169 Type 2 diabetes mellitus with other specified complication: Secondary | ICD-10-CM | POA: Diagnosis not present

## 2019-01-17 DIAGNOSIS — E038 Other specified hypothyroidism: Secondary | ICD-10-CM | POA: Diagnosis not present

## 2019-01-20 DIAGNOSIS — E1169 Type 2 diabetes mellitus with other specified complication: Secondary | ICD-10-CM | POA: Diagnosis not present

## 2019-01-20 DIAGNOSIS — E038 Other specified hypothyroidism: Secondary | ICD-10-CM | POA: Diagnosis not present

## 2019-01-25 DIAGNOSIS — F329 Major depressive disorder, single episode, unspecified: Secondary | ICD-10-CM | POA: Diagnosis not present

## 2019-01-25 DIAGNOSIS — F418 Other specified anxiety disorders: Secondary | ICD-10-CM | POA: Diagnosis not present

## 2019-01-31 DIAGNOSIS — F418 Other specified anxiety disorders: Secondary | ICD-10-CM | POA: Diagnosis not present

## 2019-01-31 DIAGNOSIS — F329 Major depressive disorder, single episode, unspecified: Secondary | ICD-10-CM | POA: Diagnosis not present

## 2019-02-09 DIAGNOSIS — E039 Hypothyroidism, unspecified: Secondary | ICD-10-CM | POA: Diagnosis not present

## 2019-02-09 DIAGNOSIS — Z0000 Encounter for general adult medical examination without abnormal findings: Secondary | ICD-10-CM | POA: Diagnosis not present

## 2019-02-09 DIAGNOSIS — E1169 Type 2 diabetes mellitus with other specified complication: Secondary | ICD-10-CM | POA: Diagnosis not present

## 2019-02-09 DIAGNOSIS — E7849 Other hyperlipidemia: Secondary | ICD-10-CM | POA: Diagnosis not present

## 2019-02-15 DIAGNOSIS — I10 Essential (primary) hypertension: Secondary | ICD-10-CM | POA: Diagnosis not present

## 2019-02-16 DIAGNOSIS — Z794 Long term (current) use of insulin: Secondary | ICD-10-CM | POA: Diagnosis not present

## 2019-02-16 DIAGNOSIS — Z0000 Encounter for general adult medical examination without abnormal findings: Secondary | ICD-10-CM | POA: Diagnosis not present

## 2019-02-16 DIAGNOSIS — L817 Pigmented purpuric dermatosis: Secondary | ICD-10-CM | POA: Diagnosis not present

## 2019-02-16 DIAGNOSIS — E1169 Type 2 diabetes mellitus with other specified complication: Secondary | ICD-10-CM | POA: Diagnosis not present

## 2019-02-16 DIAGNOSIS — F329 Major depressive disorder, single episode, unspecified: Secondary | ICD-10-CM | POA: Diagnosis not present

## 2019-02-16 DIAGNOSIS — Z1331 Encounter for screening for depression: Secondary | ICD-10-CM | POA: Diagnosis not present

## 2019-02-22 DIAGNOSIS — F329 Major depressive disorder, single episode, unspecified: Secondary | ICD-10-CM | POA: Diagnosis not present

## 2019-02-22 DIAGNOSIS — F418 Other specified anxiety disorders: Secondary | ICD-10-CM | POA: Diagnosis not present

## 2019-03-07 DIAGNOSIS — E1169 Type 2 diabetes mellitus with other specified complication: Secondary | ICD-10-CM | POA: Diagnosis not present

## 2019-03-07 DIAGNOSIS — Z794 Long term (current) use of insulin: Secondary | ICD-10-CM | POA: Diagnosis not present

## 2019-03-07 DIAGNOSIS — K219 Gastro-esophageal reflux disease without esophagitis: Secondary | ICD-10-CM | POA: Diagnosis not present

## 2019-03-07 DIAGNOSIS — I10 Essential (primary) hypertension: Secondary | ICD-10-CM | POA: Diagnosis not present

## 2019-03-21 DIAGNOSIS — F329 Major depressive disorder, single episode, unspecified: Secondary | ICD-10-CM | POA: Diagnosis not present

## 2019-04-11 DIAGNOSIS — E119 Type 2 diabetes mellitus without complications: Secondary | ICD-10-CM | POA: Diagnosis not present

## 2019-04-13 DIAGNOSIS — Z23 Encounter for immunization: Secondary | ICD-10-CM | POA: Diagnosis not present

## 2019-04-21 DIAGNOSIS — F418 Other specified anxiety disorders: Secondary | ICD-10-CM | POA: Diagnosis not present

## 2019-04-21 DIAGNOSIS — E039 Hypothyroidism, unspecified: Secondary | ICD-10-CM | POA: Diagnosis not present

## 2019-04-21 DIAGNOSIS — I10 Essential (primary) hypertension: Secondary | ICD-10-CM | POA: Diagnosis not present

## 2019-04-21 DIAGNOSIS — E1169 Type 2 diabetes mellitus with other specified complication: Secondary | ICD-10-CM | POA: Diagnosis not present

## 2019-05-17 DIAGNOSIS — Z23 Encounter for immunization: Secondary | ICD-10-CM | POA: Diagnosis not present

## 2019-05-23 DIAGNOSIS — E1165 Type 2 diabetes mellitus with hyperglycemia: Secondary | ICD-10-CM | POA: Diagnosis not present

## 2019-06-08 DIAGNOSIS — Z794 Long term (current) use of insulin: Secondary | ICD-10-CM | POA: Diagnosis not present

## 2019-06-08 DIAGNOSIS — E1169 Type 2 diabetes mellitus with other specified complication: Secondary | ICD-10-CM | POA: Diagnosis not present

## 2019-06-08 DIAGNOSIS — I10 Essential (primary) hypertension: Secondary | ICD-10-CM | POA: Diagnosis not present

## 2019-06-16 ENCOUNTER — Ambulatory Visit: Payer: Federal, State, Local not specified - PPO | Admitting: Licensed Clinical Social Worker

## 2019-06-27 DIAGNOSIS — E1169 Type 2 diabetes mellitus with other specified complication: Secondary | ICD-10-CM | POA: Diagnosis not present

## 2019-06-27 DIAGNOSIS — I10 Essential (primary) hypertension: Secondary | ICD-10-CM | POA: Diagnosis not present

## 2019-06-27 DIAGNOSIS — Z794 Long term (current) use of insulin: Secondary | ICD-10-CM | POA: Diagnosis not present

## 2019-09-06 DIAGNOSIS — E1169 Type 2 diabetes mellitus with other specified complication: Secondary | ICD-10-CM | POA: Diagnosis not present

## 2019-10-10 DIAGNOSIS — E1169 Type 2 diabetes mellitus with other specified complication: Secondary | ICD-10-CM | POA: Diagnosis not present

## 2019-10-19 DIAGNOSIS — Z23 Encounter for immunization: Secondary | ICD-10-CM | POA: Diagnosis not present

## 2019-10-19 DIAGNOSIS — I10 Essential (primary) hypertension: Secondary | ICD-10-CM | POA: Diagnosis not present

## 2019-10-19 DIAGNOSIS — E1169 Type 2 diabetes mellitus with other specified complication: Secondary | ICD-10-CM | POA: Diagnosis not present

## 2019-11-15 DIAGNOSIS — E1169 Type 2 diabetes mellitus with other specified complication: Secondary | ICD-10-CM | POA: Diagnosis not present

## 2019-12-13 DIAGNOSIS — I10 Essential (primary) hypertension: Secondary | ICD-10-CM | POA: Diagnosis not present

## 2019-12-13 DIAGNOSIS — E1169 Type 2 diabetes mellitus with other specified complication: Secondary | ICD-10-CM | POA: Diagnosis not present

## 2020-02-01 DIAGNOSIS — E119 Type 2 diabetes mellitus without complications: Secondary | ICD-10-CM | POA: Diagnosis not present

## 2020-02-02 ENCOUNTER — Ambulatory Visit (INDEPENDENT_AMBULATORY_CARE_PROVIDER_SITE_OTHER): Payer: Federal, State, Local not specified - PPO | Admitting: Podiatry

## 2020-02-02 ENCOUNTER — Other Ambulatory Visit: Payer: Self-pay

## 2020-02-02 DIAGNOSIS — L601 Onycholysis: Secondary | ICD-10-CM

## 2020-02-02 DIAGNOSIS — M79676 Pain in unspecified toe(s): Secondary | ICD-10-CM

## 2020-02-02 NOTE — Progress Notes (Signed)
  Subjective:  Patient ID: Jenna Nelson, female    DOB: [birth year],  MRN: [id number]  Chief Complaint  Patient presents with  . Nail Problem    Left 1st painful toenail   60 y.o. female presents with the above complaint. History confirmed with patient. Left 1st dark discoloration to the great toe - present for 2 weeks. Painful but slowly improving. Unsure how it started.  DM with A1c of 10  Objective:  Physical Exam: warm, good capillary refill, no trophic changes or ulcerative lesions, normal DP and PT pulses and normal sensory exam. Left Foot: onycholysis left 1st toe with subungual hemorrhage. Pain to palpation. No surrounding warmth/erythema.   Assessment:   1. Onycholysis   2. Pain around toenail     Plan:  Patient was evaluated and treated and all questions answered.  Onycholysis left great toenail; DM poorly controlled -Ingrown nail excised. See procedure note. -Educated on post-procedure care including soaking. Written instructions provided.  Procedure: Avulsion of toenail Location: Left 1st toe  Anesthesia: Lidocaine 1% plain; 1.5 mL and Marcaine 0.5% plain; 1.5 mL, digital block. Skin Prep: Betadine. Dressing: Silvadene; telfa; dry, sterile, compression dressing. Technique: Following skin prep, the toe was exsanguinated and a tourniquet was secured at the base of the toe. The nail was freed and avulsed with a hemostat. The area was cleansed. The tourniquet was then removed and sterile dressing applied. Disposition: Patient tolerated procedure well.    Return in about 2 weeks (around 02/16/2020) for Nail Check.

## 2020-02-02 NOTE — Patient Instructions (Signed)

## 2020-02-06 DIAGNOSIS — E785 Hyperlipidemia, unspecified: Secondary | ICD-10-CM | POA: Diagnosis not present

## 2020-02-06 DIAGNOSIS — E119 Type 2 diabetes mellitus without complications: Secondary | ICD-10-CM | POA: Diagnosis not present

## 2020-02-06 DIAGNOSIS — E1169 Type 2 diabetes mellitus with other specified complication: Secondary | ICD-10-CM | POA: Diagnosis not present

## 2020-02-06 DIAGNOSIS — E039 Hypothyroidism, unspecified: Secondary | ICD-10-CM | POA: Diagnosis not present

## 2020-02-16 ENCOUNTER — Ambulatory Visit: Payer: Federal, State, Local not specified - PPO | Admitting: Podiatry

## 2020-02-23 DIAGNOSIS — E039 Hypothyroidism, unspecified: Secondary | ICD-10-CM | POA: Diagnosis not present

## 2020-02-23 DIAGNOSIS — Z0000 Encounter for general adult medical examination without abnormal findings: Secondary | ICD-10-CM | POA: Diagnosis not present

## 2020-02-28 ENCOUNTER — Other Ambulatory Visit: Payer: Self-pay | Admitting: Internal Medicine

## 2020-02-28 DIAGNOSIS — E785 Hyperlipidemia, unspecified: Secondary | ICD-10-CM

## 2020-03-19 ENCOUNTER — Telehealth: Payer: Self-pay | Admitting: Podiatry

## 2020-03-19 NOTE — Telephone Encounter (Signed)
Pt called stating the nail that was removed is "bumpy" and catches on her socks and sheets. She would like to know if she needs to come back. Please advise.

## 2020-03-19 NOTE — Telephone Encounter (Signed)
Advise her to try to smooth the nail down with an emery board - if it causes problems we can have her come in for eval

## 2020-03-20 DIAGNOSIS — E1169 Type 2 diabetes mellitus with other specified complication: Secondary | ICD-10-CM | POA: Diagnosis not present

## 2020-05-13 ENCOUNTER — Other Ambulatory Visit: Payer: Federal, State, Local not specified - PPO

## 2020-05-27 ENCOUNTER — Other Ambulatory Visit: Payer: Federal, State, Local not specified - PPO

## 2020-06-05 DIAGNOSIS — E1169 Type 2 diabetes mellitus with other specified complication: Secondary | ICD-10-CM | POA: Diagnosis not present

## 2020-06-05 DIAGNOSIS — I10 Essential (primary) hypertension: Secondary | ICD-10-CM | POA: Diagnosis not present

## 2020-06-06 DIAGNOSIS — E1169 Type 2 diabetes mellitus with other specified complication: Secondary | ICD-10-CM | POA: Diagnosis not present

## 2020-06-10 ENCOUNTER — Other Ambulatory Visit: Payer: Federal, State, Local not specified - PPO

## 2020-07-11 ENCOUNTER — Inpatient Hospital Stay: Admission: RE | Admit: 2020-07-11 | Payer: Federal, State, Local not specified - PPO | Source: Ambulatory Visit

## 2020-07-24 DIAGNOSIS — E1169 Type 2 diabetes mellitus with other specified complication: Secondary | ICD-10-CM | POA: Diagnosis not present

## 2020-08-14 ENCOUNTER — Other Ambulatory Visit: Payer: Federal, State, Local not specified - PPO

## 2020-08-22 IMAGING — MR MR HEAD WO/W CM
10 of 14 series · 29 of 48 positions shown · IV contrast (gadavist)
Comparison: Head CT 10/24/2018

CLINICAL DATA: Seizure. Eye deviation and unresponsiveness. DKA.

EXAM:
MRI HEAD WITHOUT AND WITH CONTRAST
TECHNIQUE: Multiplanar, multiecho pulse sequences of the brain and surrounding
structures were obtained without and with intravenous contrast.
CONTRAST:  7mL GADAVIST GADOBUTROL 1 MMOL/ML IV SOLN

[Series 2: ax dwi_tracew · axial · 3.0mm · 0.83mm/px · z∈[-120,-58]mm · 3 of 55 slices shown]
[im 1/55]
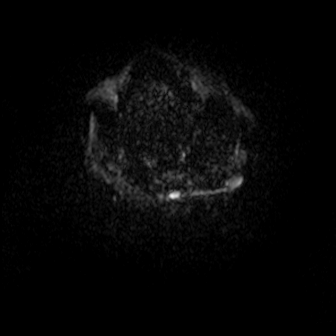
[im 11/55]
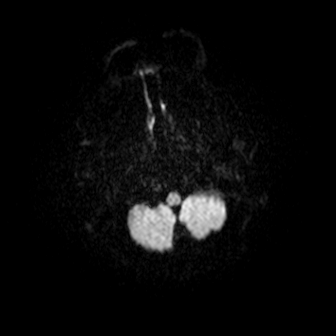
[im 22/55]
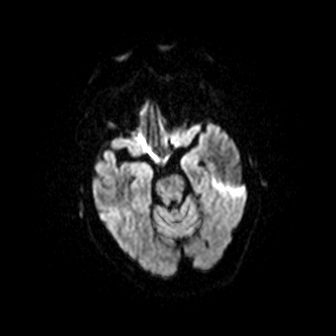

[Series 6: T1 · sagittal · 5.0mm · 0.94mm/px · 2 of 25 slices shown (1 of 2)]
[im 1/25]
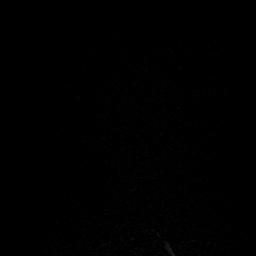
[im 25/25]
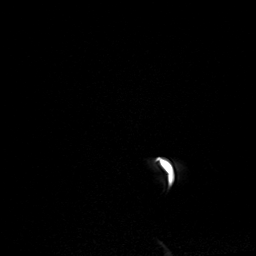

[Series 7: T2 · axial · 5.0mm · 0.45mm/px · z∈[-116,+40]mm · 3 of 27 slices shown (1 of 3)]
[im 1/27]
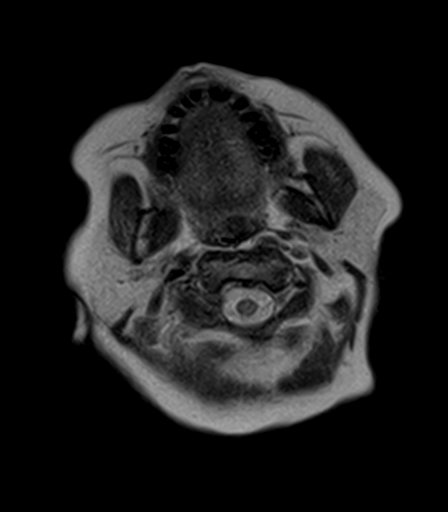
[im 14/27]
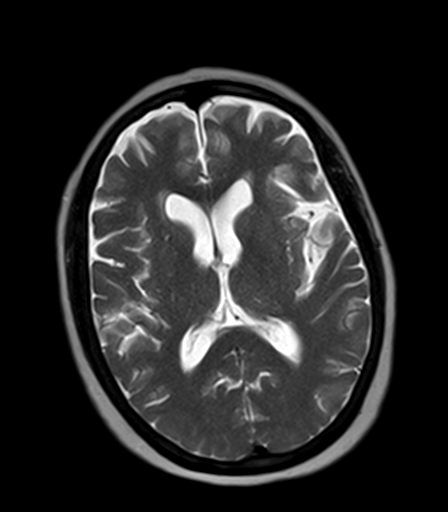
[im 27/27]
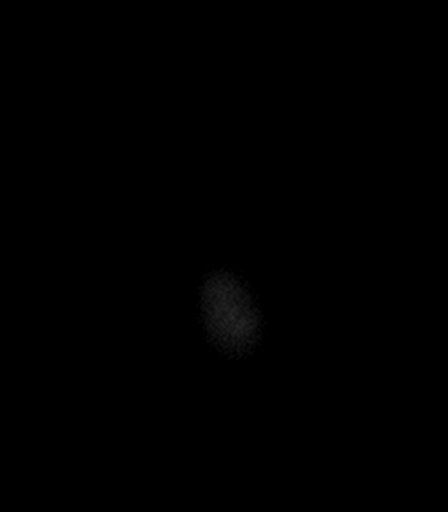

[Series 9: FLAIR · axial · 5.0mm · 1.20mm/px · z∈[-116,+40]mm · 3 of 27 slices shown (1 of 2)]
[im 1/27]
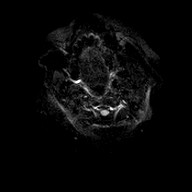
[im 14/27]
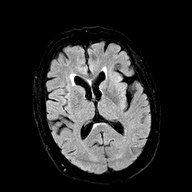
[im 27/27]
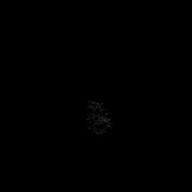

[Series 10: T1 · axial · 5.0mm · 0.90mm/px · z∈[-116,+40]mm · 3 of 27 slices shown (2 of 2)]
[im 1/27]
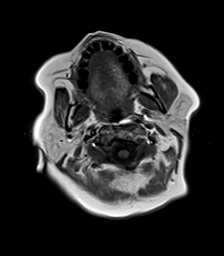
[im 14/27]
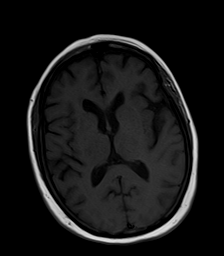
[im 27/27]
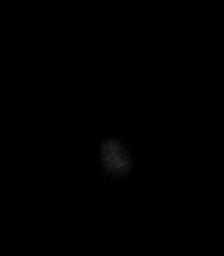

[Series 11: T2 · coronal · 3.0mm · 0.23mm/px · 3 of 35 slices shown (2 of 3)]
[im 1/35]
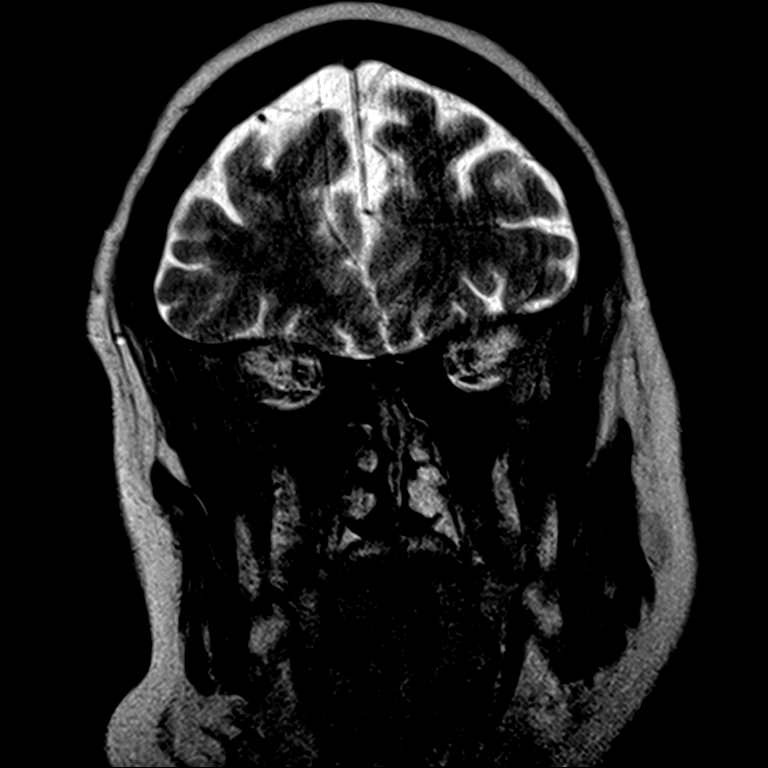
[im 18/35]
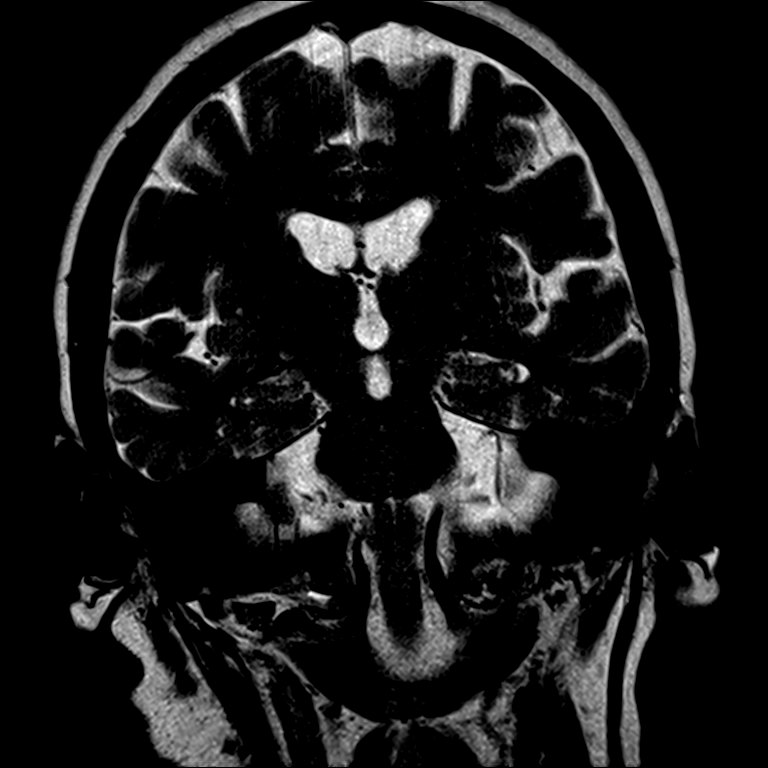
[im 35/35]
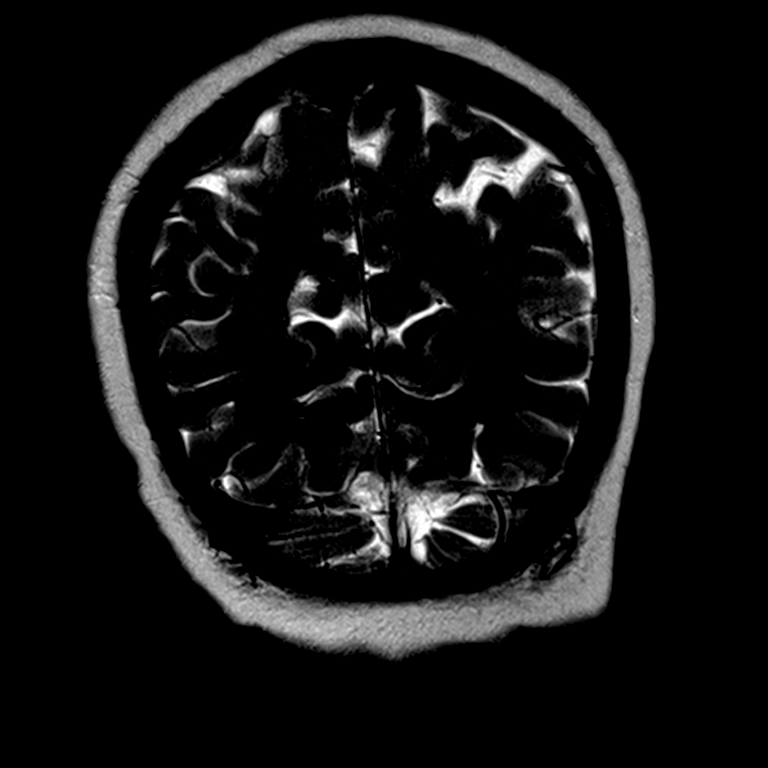

[Series 12: FLAIR · coronal · 3.0mm · 0.35mm/px · 3 of 35 slices shown (2 of 2)]
[im 1/35]
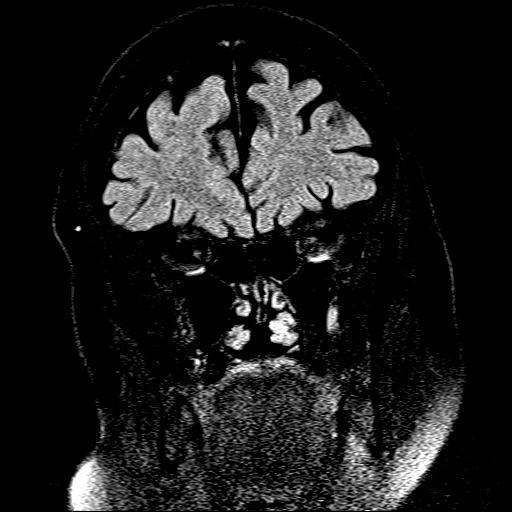
[im 18/35]
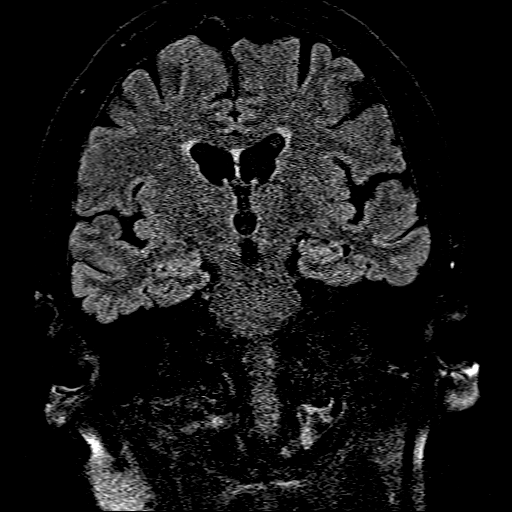
[im 35/35]
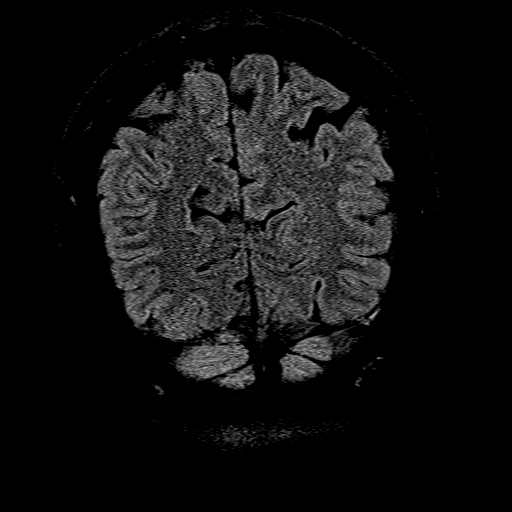

[Series 13: T2 · coronal · 5.0mm · 0.45mm/px · 3 of 31 slices shown (3 of 3)]
[im 1/31]
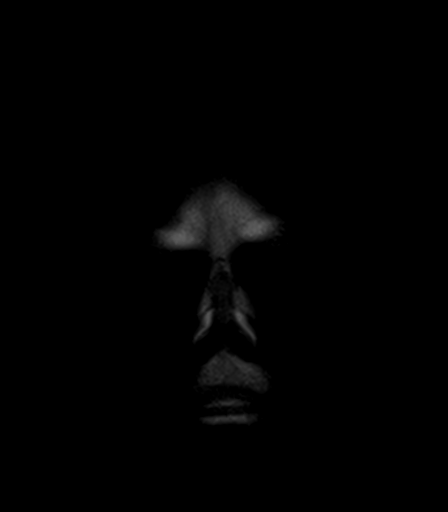
[im 16/31]
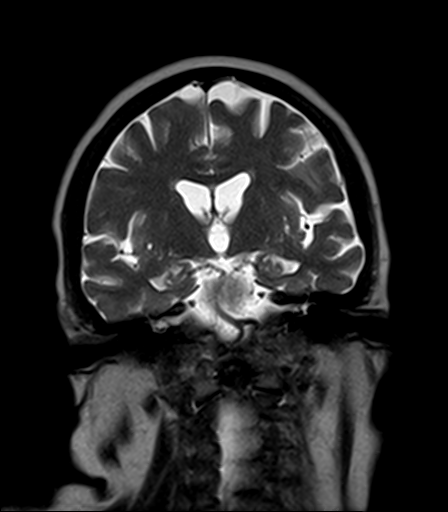
[im 31/31]
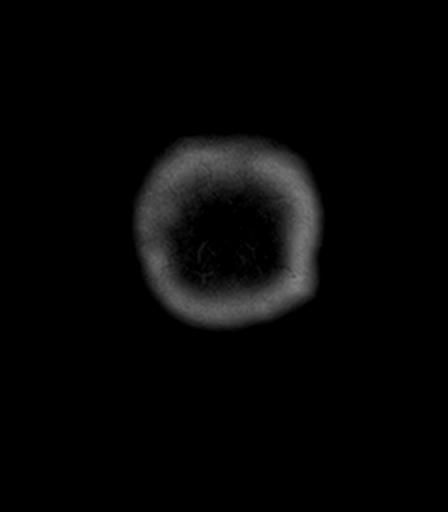

[Series 14: T1 post-contrast · axial · 5.0mm · 0.90mm/px · z∈[-116,+40]mm · 3 of 27 slices shown (1 of 2)]
[im 1/27]
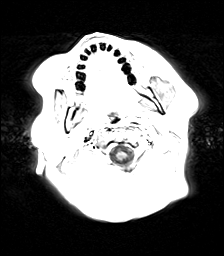
[im 14/27]
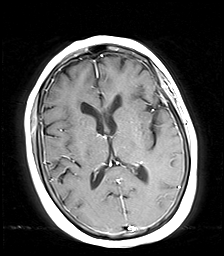
[im 27/27]
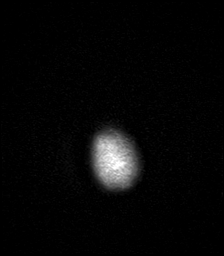

[Series 15: T1 post-contrast · coronal · 5.0mm · 0.90mm/px · 3 of 31 slices shown (2 of 2)]
[im 1/31]
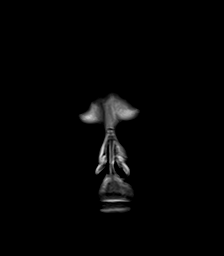
[im 16/31]
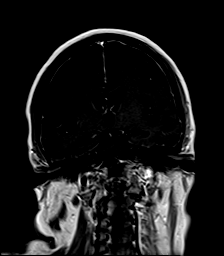
[im 31/31]
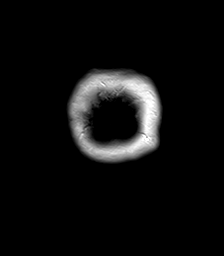

[29 of 48 positions shown; findings below may reference images not displayed]

FINDINGS: Brain: There is no evidence of acute infarct, intracranial
hemorrhage, mass, midline shift, or extra-axial fluid collection.
The ventricles and sulci are within normal limits for age. T2
hyperintensities in the cerebral white matter are nonspecific but
compatible with mild chronic small vessel ischemic disease. No
abnormal enhancement is identified. Dedicated temporal lobe imaging
is mildly motion degraded with grossly symmetric hippocampal size
and no gross signal abnormality. Hippocampal sulcus remnant cysts
are incidentally noted bilaterally.

Vascular: Major intracranial vascular flow voids are preserved.

Skull and upper cervical spine: Unremarkable bone marrow signal.

Sinuses/Orbits: Unremarkable orbits. Subcentimeter right maxillary
sinus mucous retention cyst. Clear mastoid air cells.

Other: None.
IMPRESSION: 1. No acute intracranial abnormality.
2. Mild chronic small vessel ischemic disease.

## 2020-08-22 IMAGING — CT CT HEAD W/O CM
3 series · 16 of 47 positions shown, 19 images · non-contrast
Comparison: None.

CLINICAL DATA: Speech difficulty

EXAM:
CT HEAD WITHOUT CONTRAST
TECHNIQUE: Contiguous axial images were obtained from the base of the skull
through the vertex without intravenous contrast.

[Series 3: head wo · axial · 0.42mm/px · z∈[-109,+16]mm · 10 of 30 slices shown, 13 images]
[im 3/30  brain]
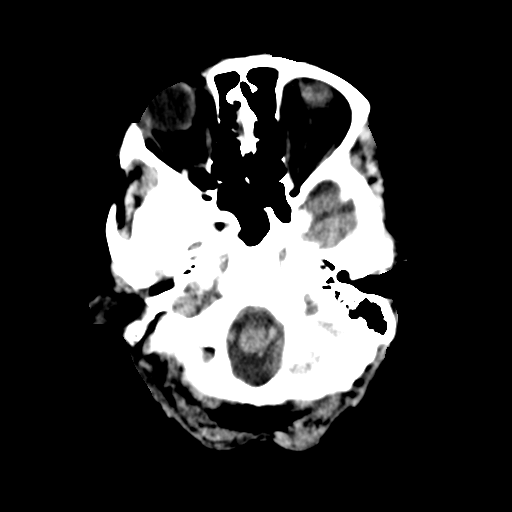
[im 3/30  bone]
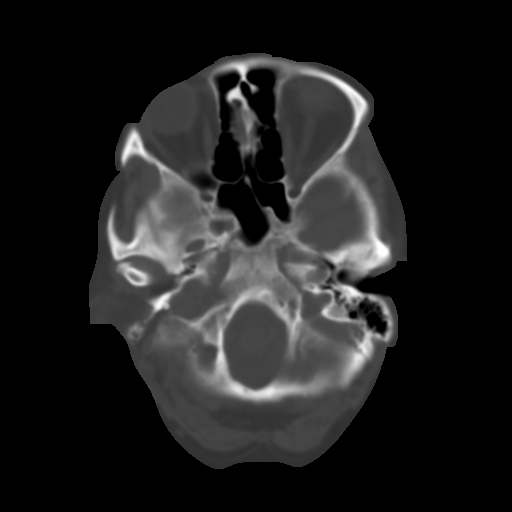
[im 6/30  brain]
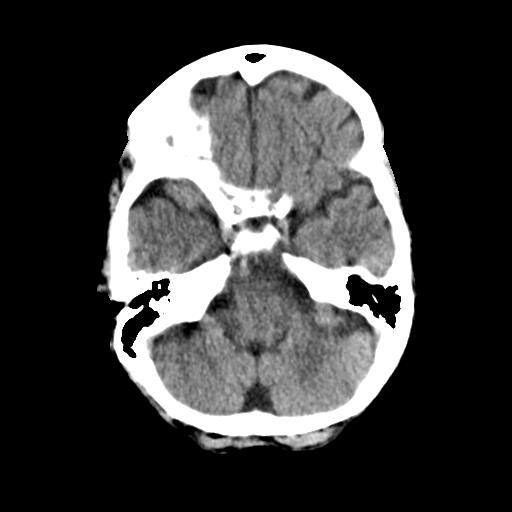
[im 9/30  brain]
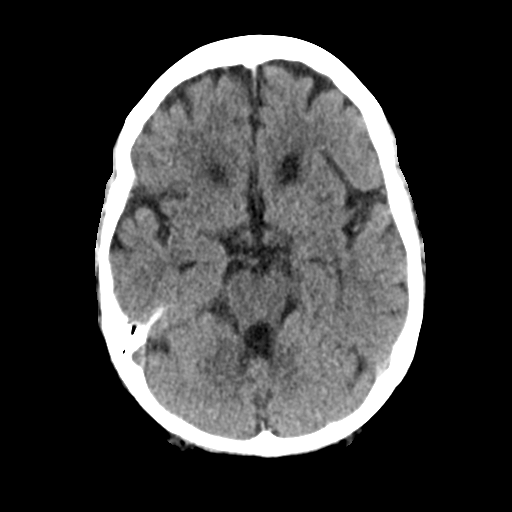
[im 11/30  brain]
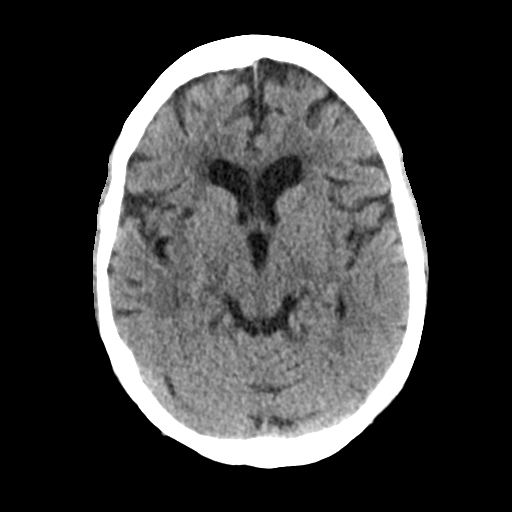
[im 14/30  brain]
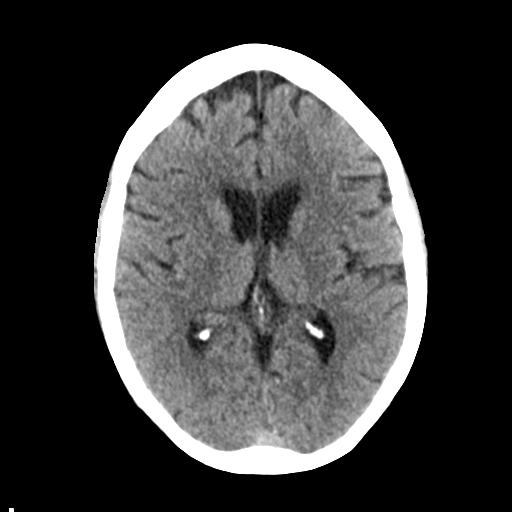
[im 14/30  bone]
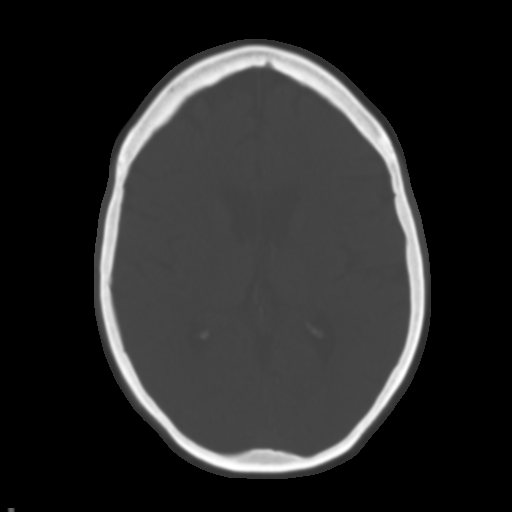
[im 17/30  brain]
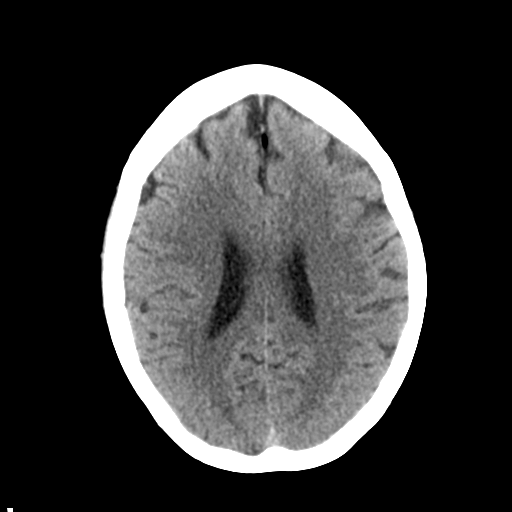
[im 20/30  brain]
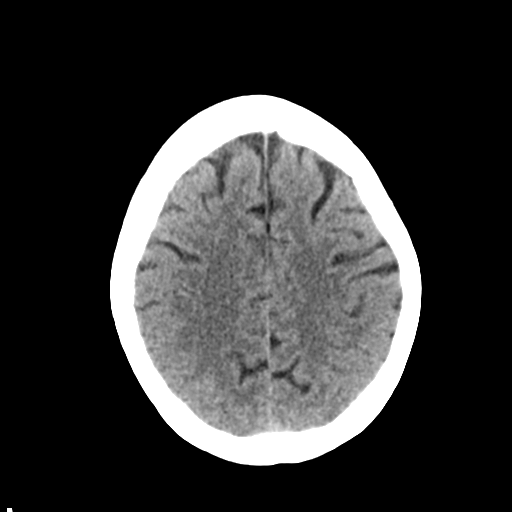
[im 23/30  brain]
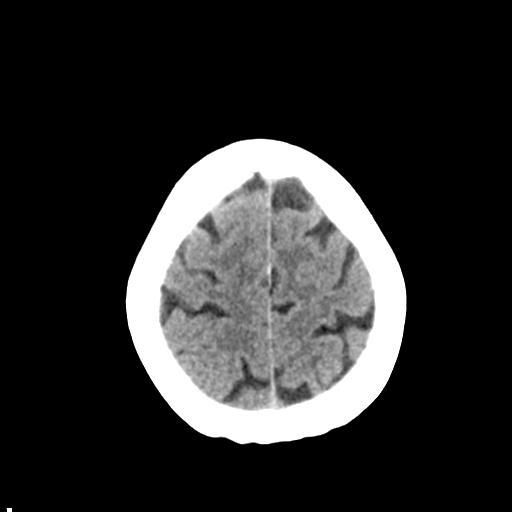
[im 25/30  brain]
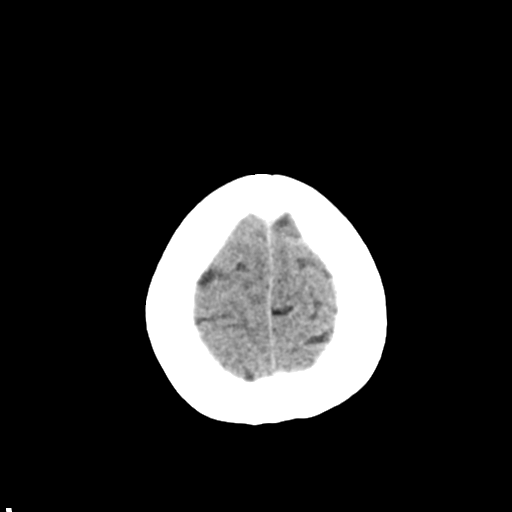
[im 25/30  bone]
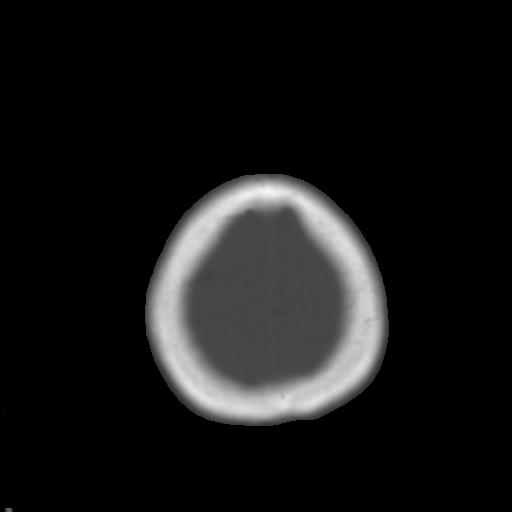
[im 28/30  brain]
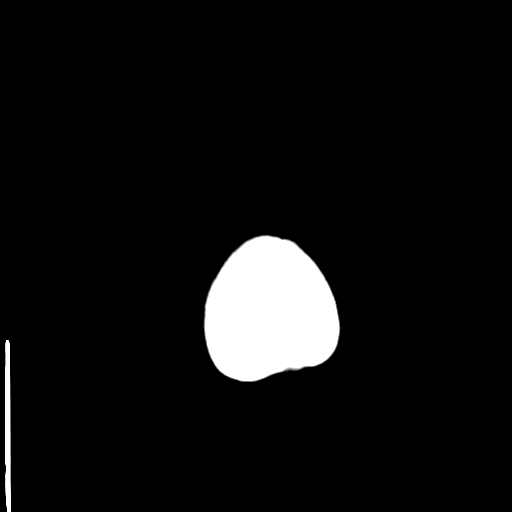

[Series 4: coronal soft tissue · coronal · 0.29mm/px · 3 of 61 slices shown]
[im 21/61  brain]
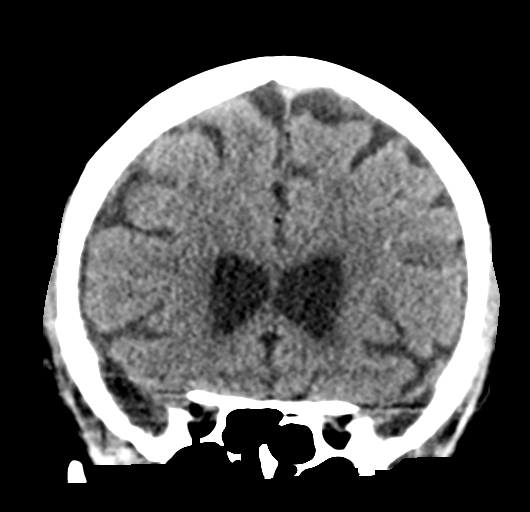
[im 27/61  brain]
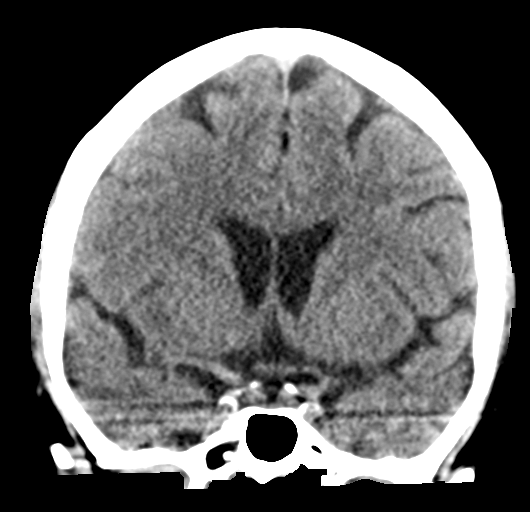
[im 34/61  brain]
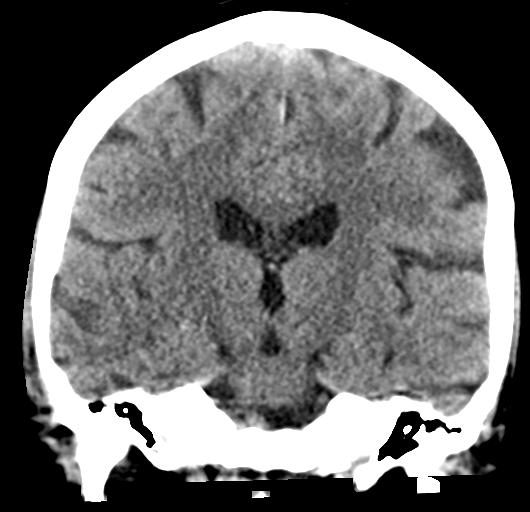

[Series 5: sagittal soft tissue · sagittal · 0.29mm/px · 3 of 45 slices shown]
[im 15/45  brain]
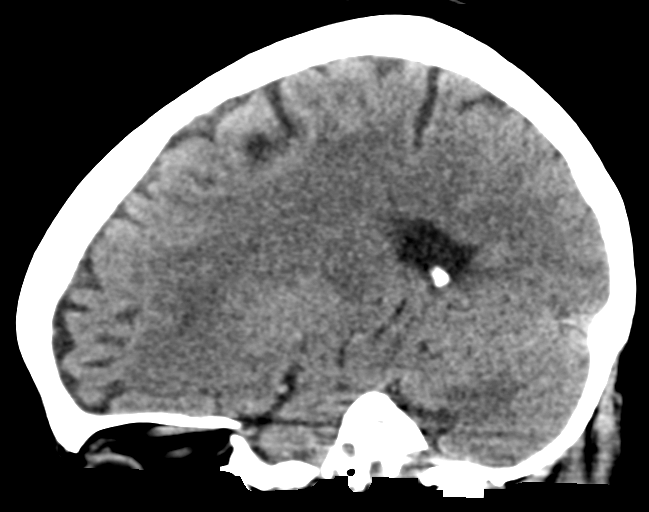
[im 23/45  brain]
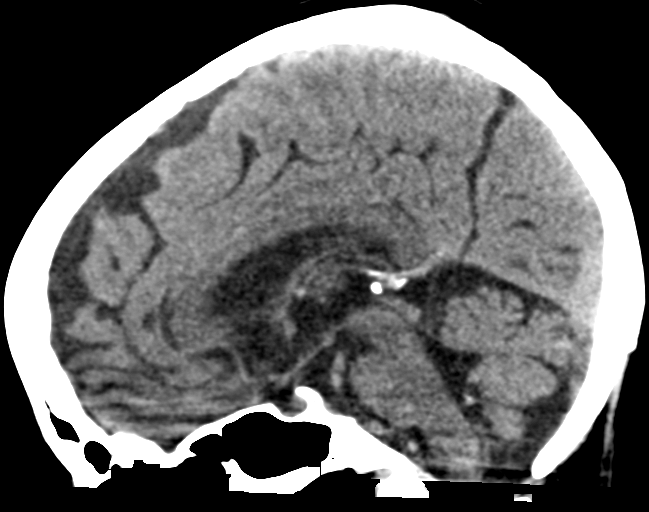
[im 30/45  brain]
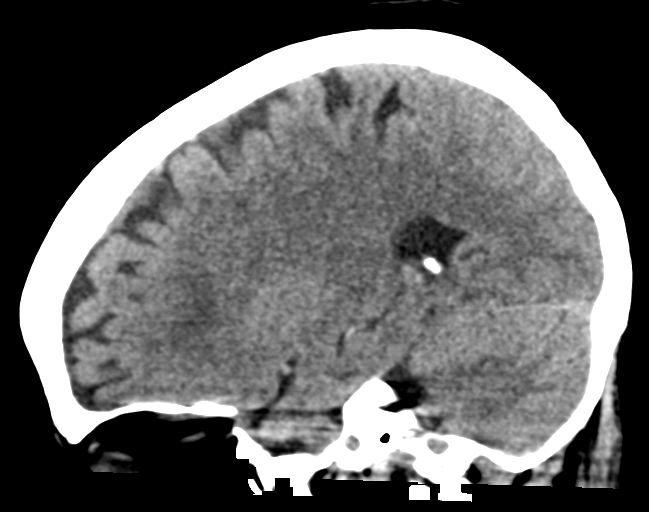

[16 of 47 positions shown; findings below may reference images not displayed]

FINDINGS: Brain: No evidence of acute infarction, hemorrhage, hydrocephalus,
extra-axial collection or mass lesion/mass effect.

Vascular: No hyperdense vessel or unexpected calcification.

Skull: Normal. Negative for fracture or focal lesion.

Sinuses/Orbits: No acute finding.
IMPRESSION: Negative head CT.

## 2020-08-28 ENCOUNTER — Inpatient Hospital Stay: Admission: RE | Admit: 2020-08-28 | Payer: Federal, State, Local not specified - PPO | Source: Ambulatory Visit

## 2020-09-05 DIAGNOSIS — R918 Other nonspecific abnormal finding of lung field: Secondary | ICD-10-CM | POA: Diagnosis not present

## 2020-09-05 DIAGNOSIS — I517 Cardiomegaly: Secondary | ICD-10-CM | POA: Diagnosis not present

## 2020-09-05 DIAGNOSIS — E1110 Type 2 diabetes mellitus with ketoacidosis without coma: Secondary | ICD-10-CM | POA: Diagnosis not present

## 2020-09-05 DIAGNOSIS — F419 Anxiety disorder, unspecified: Secondary | ICD-10-CM | POA: Diagnosis not present

## 2020-09-05 DIAGNOSIS — E039 Hypothyroidism, unspecified: Secondary | ICD-10-CM | POA: Diagnosis not present

## 2020-09-05 DIAGNOSIS — I493 Ventricular premature depolarization: Secondary | ICD-10-CM | POA: Diagnosis not present

## 2020-09-05 DIAGNOSIS — I10 Essential (primary) hypertension: Secondary | ICD-10-CM | POA: Diagnosis not present

## 2020-09-05 DIAGNOSIS — Z79899 Other long term (current) drug therapy: Secondary | ICD-10-CM | POA: Diagnosis not present

## 2020-09-05 DIAGNOSIS — E1010 Type 1 diabetes mellitus with ketoacidosis without coma: Secondary | ICD-10-CM | POA: Diagnosis not present

## 2020-09-05 DIAGNOSIS — R531 Weakness: Secondary | ICD-10-CM | POA: Diagnosis not present

## 2020-09-05 DIAGNOSIS — I429 Cardiomyopathy, unspecified: Secondary | ICD-10-CM | POA: Diagnosis not present

## 2020-09-05 DIAGNOSIS — F32A Depression, unspecified: Secondary | ICD-10-CM | POA: Diagnosis not present

## 2020-09-05 DIAGNOSIS — K219 Gastro-esophageal reflux disease without esophagitis: Secondary | ICD-10-CM | POA: Diagnosis not present

## 2020-09-05 DIAGNOSIS — G9340 Encephalopathy, unspecified: Secondary | ICD-10-CM | POA: Diagnosis not present

## 2020-09-05 DIAGNOSIS — J986 Disorders of diaphragm: Secondary | ICD-10-CM | POA: Diagnosis not present

## 2020-09-05 DIAGNOSIS — Z794 Long term (current) use of insulin: Secondary | ICD-10-CM | POA: Diagnosis not present

## 2020-09-06 DIAGNOSIS — I420 Dilated cardiomyopathy: Secondary | ICD-10-CM | POA: Diagnosis not present

## 2020-09-06 DIAGNOSIS — R4182 Altered mental status, unspecified: Secondary | ICD-10-CM | POA: Diagnosis not present

## 2020-09-06 DIAGNOSIS — E1110 Type 2 diabetes mellitus with ketoacidosis without coma: Secondary | ICD-10-CM | POA: Diagnosis not present

## 2020-09-06 DIAGNOSIS — I10 Essential (primary) hypertension: Secondary | ICD-10-CM | POA: Diagnosis not present

## 2020-09-07 DIAGNOSIS — E1110 Type 2 diabetes mellitus with ketoacidosis without coma: Secondary | ICD-10-CM | POA: Diagnosis not present

## 2020-09-07 DIAGNOSIS — I10 Essential (primary) hypertension: Secondary | ICD-10-CM | POA: Diagnosis not present

## 2020-09-07 DIAGNOSIS — I420 Dilated cardiomyopathy: Secondary | ICD-10-CM | POA: Diagnosis not present

## 2020-09-11 DIAGNOSIS — E1169 Type 2 diabetes mellitus with other specified complication: Secondary | ICD-10-CM | POA: Diagnosis not present

## 2020-09-12 DIAGNOSIS — E1169 Type 2 diabetes mellitus with other specified complication: Secondary | ICD-10-CM | POA: Diagnosis not present

## 2020-09-18 DIAGNOSIS — E1169 Type 2 diabetes mellitus with other specified complication: Secondary | ICD-10-CM | POA: Diagnosis not present

## 2020-09-26 DIAGNOSIS — E1169 Type 2 diabetes mellitus with other specified complication: Secondary | ICD-10-CM | POA: Diagnosis not present

## 2020-09-26 DIAGNOSIS — Z23 Encounter for immunization: Secondary | ICD-10-CM | POA: Diagnosis not present

## 2020-10-02 DIAGNOSIS — E1169 Type 2 diabetes mellitus with other specified complication: Secondary | ICD-10-CM | POA: Diagnosis not present

## 2020-10-24 DIAGNOSIS — E1169 Type 2 diabetes mellitus with other specified complication: Secondary | ICD-10-CM | POA: Diagnosis not present

## 2020-11-07 DIAGNOSIS — E119 Type 2 diabetes mellitus without complications: Secondary | ICD-10-CM | POA: Diagnosis not present

## 2020-11-27 DIAGNOSIS — E1169 Type 2 diabetes mellitus with other specified complication: Secondary | ICD-10-CM | POA: Diagnosis not present

## 2021-01-21 DIAGNOSIS — E1169 Type 2 diabetes mellitus with other specified complication: Secondary | ICD-10-CM | POA: Diagnosis not present

## 2021-04-22 DIAGNOSIS — Z1211 Encounter for screening for malignant neoplasm of colon: Secondary | ICD-10-CM | POA: Diagnosis not present

## 2021-04-22 DIAGNOSIS — R194 Change in bowel habit: Secondary | ICD-10-CM | POA: Diagnosis not present

## 2021-04-22 DIAGNOSIS — K219 Gastro-esophageal reflux disease without esophagitis: Secondary | ICD-10-CM | POA: Diagnosis not present

## 2021-04-22 DIAGNOSIS — R159 Full incontinence of feces: Secondary | ICD-10-CM | POA: Diagnosis not present

## 2021-04-23 DIAGNOSIS — E1169 Type 2 diabetes mellitus with other specified complication: Secondary | ICD-10-CM | POA: Diagnosis not present

## 2021-04-23 DIAGNOSIS — I10 Essential (primary) hypertension: Secondary | ICD-10-CM | POA: Diagnosis not present

## 2021-05-27 DIAGNOSIS — E1169 Type 2 diabetes mellitus with other specified complication: Secondary | ICD-10-CM | POA: Diagnosis not present

## 2021-07-24 DIAGNOSIS — I10 Essential (primary) hypertension: Secondary | ICD-10-CM | POA: Diagnosis not present

## 2021-07-24 DIAGNOSIS — E1169 Type 2 diabetes mellitus with other specified complication: Secondary | ICD-10-CM | POA: Diagnosis not present

## 2021-08-20 DIAGNOSIS — D124 Benign neoplasm of descending colon: Secondary | ICD-10-CM | POA: Diagnosis not present

## 2021-08-20 DIAGNOSIS — K635 Polyp of colon: Secondary | ICD-10-CM | POA: Diagnosis not present

## 2021-08-20 DIAGNOSIS — D175 Benign lipomatous neoplasm of intra-abdominal organs: Secondary | ICD-10-CM | POA: Diagnosis not present

## 2021-08-20 DIAGNOSIS — Z1211 Encounter for screening for malignant neoplasm of colon: Secondary | ICD-10-CM | POA: Diagnosis not present

## 2021-09-09 DIAGNOSIS — L814 Other melanin hyperpigmentation: Secondary | ICD-10-CM | POA: Diagnosis not present

## 2021-09-09 DIAGNOSIS — B078 Other viral warts: Secondary | ICD-10-CM | POA: Diagnosis not present

## 2021-09-09 DIAGNOSIS — L820 Inflamed seborrheic keratosis: Secondary | ICD-10-CM | POA: Diagnosis not present

## 2021-09-09 DIAGNOSIS — L821 Other seborrheic keratosis: Secondary | ICD-10-CM | POA: Diagnosis not present

## 2021-09-09 DIAGNOSIS — D225 Melanocytic nevi of trunk: Secondary | ICD-10-CM | POA: Diagnosis not present

## 2021-10-01 DIAGNOSIS — N760 Acute vaginitis: Secondary | ICD-10-CM | POA: Diagnosis not present

## 2021-10-16 DIAGNOSIS — Z01419 Encounter for gynecological examination (general) (routine) without abnormal findings: Secondary | ICD-10-CM | POA: Diagnosis not present

## 2021-10-16 DIAGNOSIS — Z7689 Persons encountering health services in other specified circumstances: Secondary | ICD-10-CM | POA: Diagnosis not present

## 2021-10-16 DIAGNOSIS — N760 Acute vaginitis: Secondary | ICD-10-CM | POA: Diagnosis not present

## 2021-10-16 DIAGNOSIS — Z13820 Encounter for screening for osteoporosis: Secondary | ICD-10-CM | POA: Diagnosis not present

## 2021-10-16 DIAGNOSIS — Z1231 Encounter for screening mammogram for malignant neoplasm of breast: Secondary | ICD-10-CM | POA: Diagnosis not present

## 2021-10-16 DIAGNOSIS — Z1272 Encounter for screening for malignant neoplasm of vagina: Secondary | ICD-10-CM | POA: Diagnosis not present

## 2021-10-16 DIAGNOSIS — Z6829 Body mass index (BMI) 29.0-29.9, adult: Secondary | ICD-10-CM | POA: Diagnosis not present

## 2021-10-21 ENCOUNTER — Other Ambulatory Visit: Payer: Self-pay | Admitting: Obstetrics and Gynecology

## 2021-10-21 DIAGNOSIS — R928 Other abnormal and inconclusive findings on diagnostic imaging of breast: Secondary | ICD-10-CM

## 2021-10-23 DIAGNOSIS — E1169 Type 2 diabetes mellitus with other specified complication: Secondary | ICD-10-CM | POA: Diagnosis not present

## 2021-10-23 DIAGNOSIS — E039 Hypothyroidism, unspecified: Secondary | ICD-10-CM | POA: Diagnosis not present

## 2021-10-23 DIAGNOSIS — I10 Essential (primary) hypertension: Secondary | ICD-10-CM | POA: Diagnosis not present

## 2021-10-29 ENCOUNTER — Other Ambulatory Visit: Payer: Self-pay | Admitting: Obstetrics and Gynecology

## 2021-10-29 ENCOUNTER — Ambulatory Visit
Admission: RE | Admit: 2021-10-29 | Discharge: 2021-10-29 | Disposition: A | Discharge status: Home / Self Care | Payer: Federal, State, Local not specified - PPO | Source: Ambulatory Visit | Attending: Obstetrics and Gynecology | Admitting: Obstetrics and Gynecology

## 2021-10-29 DIAGNOSIS — N6310 Unspecified lump in the right breast, unspecified quadrant: Secondary | ICD-10-CM

## 2021-10-29 DIAGNOSIS — R92321 Mammographic fibroglandular density, right breast: Secondary | ICD-10-CM | POA: Diagnosis not present

## 2021-10-29 DIAGNOSIS — R928 Other abnormal and inconclusive findings on diagnostic imaging of breast: Secondary | ICD-10-CM

## 2021-10-29 DIAGNOSIS — N6011 Diffuse cystic mastopathy of right breast: Secondary | ICD-10-CM | POA: Diagnosis not present

## 2021-11-12 ENCOUNTER — Other Ambulatory Visit: Payer: Self-pay | Admitting: Internal Medicine

## 2021-11-12 DIAGNOSIS — I340 Nonrheumatic mitral (valve) insufficiency: Secondary | ICD-10-CM | POA: Diagnosis not present

## 2021-11-12 DIAGNOSIS — E1169 Type 2 diabetes mellitus with other specified complication: Secondary | ICD-10-CM | POA: Diagnosis not present

## 2021-11-12 DIAGNOSIS — I10 Essential (primary) hypertension: Secondary | ICD-10-CM | POA: Diagnosis not present

## 2021-11-12 DIAGNOSIS — Z1339 Encounter for screening examination for other mental health and behavioral disorders: Secondary | ICD-10-CM | POA: Diagnosis not present

## 2021-11-12 DIAGNOSIS — Z0000 Encounter for general adult medical examination without abnormal findings: Secondary | ICD-10-CM | POA: Diagnosis not present

## 2021-11-12 DIAGNOSIS — E785 Hyperlipidemia, unspecified: Secondary | ICD-10-CM

## 2021-11-12 DIAGNOSIS — Z1331 Encounter for screening for depression: Secondary | ICD-10-CM | POA: Diagnosis not present

## 2021-11-12 DIAGNOSIS — R82998 Other abnormal findings in urine: Secondary | ICD-10-CM | POA: Diagnosis not present

## 2021-12-23 ENCOUNTER — Other Ambulatory Visit: Payer: Federal, State, Local not specified - PPO

## 2022-01-21 DIAGNOSIS — M25512 Pain in left shoulder: Secondary | ICD-10-CM | POA: Diagnosis not present

## 2022-01-21 DIAGNOSIS — E1169 Type 2 diabetes mellitus with other specified complication: Secondary | ICD-10-CM | POA: Diagnosis not present

## 2022-01-23 ENCOUNTER — Other Ambulatory Visit: Payer: Federal, State, Local not specified - PPO

## 2022-01-28 DIAGNOSIS — E1169 Type 2 diabetes mellitus with other specified complication: Secondary | ICD-10-CM | POA: Diagnosis not present

## 2022-01-28 DIAGNOSIS — I10 Essential (primary) hypertension: Secondary | ICD-10-CM | POA: Diagnosis not present

## 2022-02-19 ENCOUNTER — Other Ambulatory Visit: Payer: Federal, State, Local not specified - PPO

## 2022-04-02 DIAGNOSIS — R152 Fecal urgency: Secondary | ICD-10-CM | POA: Diagnosis not present

## 2022-04-02 DIAGNOSIS — E669 Obesity, unspecified: Secondary | ICD-10-CM | POA: Diagnosis not present

## 2022-04-02 DIAGNOSIS — K219 Gastro-esophageal reflux disease without esophagitis: Secondary | ICD-10-CM | POA: Diagnosis not present

## 2022-04-02 DIAGNOSIS — K9089 Other intestinal malabsorption: Secondary | ICD-10-CM | POA: Diagnosis not present

## 2022-04-07 DIAGNOSIS — M25512 Pain in left shoulder: Secondary | ICD-10-CM | POA: Diagnosis not present

## 2022-05-01 ENCOUNTER — Other Ambulatory Visit: Payer: Federal, State, Local not specified - PPO

## 2022-05-05 ENCOUNTER — Other Ambulatory Visit: Payer: Federal, State, Local not specified - PPO

## 2022-05-14 ENCOUNTER — Ambulatory Visit
Admission: RE | Admit: 2022-05-14 | Discharge: 2022-05-14 | Disposition: A | Discharge status: Home / Self Care | Payer: Federal, State, Local not specified - PPO | Source: Ambulatory Visit | Attending: Obstetrics and Gynecology | Admitting: Obstetrics and Gynecology

## 2022-05-14 ENCOUNTER — Ambulatory Visit: Payer: Federal, State, Local not specified - PPO

## 2022-05-14 DIAGNOSIS — N6310 Unspecified lump in the right breast, unspecified quadrant: Secondary | ICD-10-CM

## 2022-05-14 DIAGNOSIS — N63 Unspecified lump in unspecified breast: Secondary | ICD-10-CM | POA: Diagnosis not present

## 2022-05-21 DIAGNOSIS — E1169 Type 2 diabetes mellitus with other specified complication: Secondary | ICD-10-CM | POA: Diagnosis not present

## 2022-05-21 DIAGNOSIS — Z794 Long term (current) use of insulin: Secondary | ICD-10-CM | POA: Diagnosis not present

## 2022-05-21 DIAGNOSIS — I10 Essential (primary) hypertension: Secondary | ICD-10-CM | POA: Diagnosis not present

## 2022-05-21 DIAGNOSIS — E785 Hyperlipidemia, unspecified: Secondary | ICD-10-CM | POA: Diagnosis not present

## 2022-06-03 ENCOUNTER — Other Ambulatory Visit: Payer: Self-pay | Admitting: Obstetrics and Gynecology

## 2022-06-03 DIAGNOSIS — N6310 Unspecified lump in the right breast, unspecified quadrant: Secondary | ICD-10-CM

## 2022-06-10 ENCOUNTER — Other Ambulatory Visit: Payer: Federal, State, Local not specified - PPO

## 2022-07-22 ENCOUNTER — Inpatient Hospital Stay: Admission: RE | Admit: 2022-07-22 | Payer: Federal, State, Local not specified - PPO | Source: Ambulatory Visit

## 2022-08-20 ENCOUNTER — Other Ambulatory Visit: Payer: Federal, State, Local not specified - PPO

## 2022-09-08 DIAGNOSIS — E1169 Type 2 diabetes mellitus with other specified complication: Secondary | ICD-10-CM | POA: Diagnosis not present

## 2022-09-08 DIAGNOSIS — E039 Hypothyroidism, unspecified: Secondary | ICD-10-CM | POA: Diagnosis not present

## 2022-09-08 DIAGNOSIS — Z23 Encounter for immunization: Secondary | ICD-10-CM | POA: Diagnosis not present

## 2022-10-01 ENCOUNTER — Other Ambulatory Visit: Payer: Federal, State, Local not specified - PPO

## 2022-10-15 ENCOUNTER — Other Ambulatory Visit: Payer: Federal, State, Local not specified - PPO

## 2022-10-29 ENCOUNTER — Other Ambulatory Visit: Payer: Federal, State, Local not specified - PPO

## 2022-11-11 ENCOUNTER — Other Ambulatory Visit: Payer: Federal, State, Local not specified - PPO

## 2022-12-15 DIAGNOSIS — M7062 Trochanteric bursitis, left hip: Secondary | ICD-10-CM | POA: Diagnosis not present

## 2023-01-19 DIAGNOSIS — B3731 Acute candidiasis of vulva and vagina: Secondary | ICD-10-CM | POA: Diagnosis not present

## 2023-03-22 ENCOUNTER — Encounter

## 2023-03-24 DIAGNOSIS — M7062 Trochanteric bursitis, left hip: Secondary | ICD-10-CM | POA: Diagnosis not present

## 2023-03-24 DIAGNOSIS — M5416 Radiculopathy, lumbar region: Secondary | ICD-10-CM | POA: Diagnosis not present

## 2023-04-01 ENCOUNTER — Encounter

## 2023-04-09 ENCOUNTER — Ambulatory Visit
Admission: RE | Admit: 2023-04-09 | Discharge: 2023-04-09 | Disposition: A | Discharge status: Home / Self Care | Source: Ambulatory Visit | Attending: Obstetrics and Gynecology | Admitting: Obstetrics and Gynecology

## 2023-04-09 DIAGNOSIS — N6310 Unspecified lump in the right breast, unspecified quadrant: Secondary | ICD-10-CM

## 2023-06-18 DIAGNOSIS — E1169 Type 2 diabetes mellitus with other specified complication: Secondary | ICD-10-CM | POA: Diagnosis not present

## 2023-06-18 DIAGNOSIS — I10 Essential (primary) hypertension: Secondary | ICD-10-CM | POA: Diagnosis not present

## 2023-06-18 DIAGNOSIS — D649 Anemia, unspecified: Secondary | ICD-10-CM | POA: Diagnosis not present

## 2023-06-18 DIAGNOSIS — E785 Hyperlipidemia, unspecified: Secondary | ICD-10-CM | POA: Diagnosis not present

## 2023-06-18 DIAGNOSIS — E559 Vitamin D deficiency, unspecified: Secondary | ICD-10-CM | POA: Diagnosis not present

## 2023-06-18 DIAGNOSIS — E039 Hypothyroidism, unspecified: Secondary | ICD-10-CM | POA: Diagnosis not present

## 2023-06-22 DIAGNOSIS — E1169 Type 2 diabetes mellitus with other specified complication: Secondary | ICD-10-CM | POA: Diagnosis not present

## 2023-06-22 DIAGNOSIS — Z0000 Encounter for general adult medical examination without abnormal findings: Secondary | ICD-10-CM | POA: Diagnosis not present

## 2023-07-01 DIAGNOSIS — I10 Essential (primary) hypertension: Secondary | ICD-10-CM | POA: Diagnosis not present

## 2023-07-01 DIAGNOSIS — E1169 Type 2 diabetes mellitus with other specified complication: Secondary | ICD-10-CM | POA: Diagnosis not present

## 2023-07-06 DIAGNOSIS — Z1272 Encounter for screening for malignant neoplasm of vagina: Secondary | ICD-10-CM | POA: Diagnosis not present

## 2023-07-06 DIAGNOSIS — Z01419 Encounter for gynecological examination (general) (routine) without abnormal findings: Secondary | ICD-10-CM | POA: Diagnosis not present

## 2023-07-06 DIAGNOSIS — N760 Acute vaginitis: Secondary | ICD-10-CM | POA: Diagnosis not present

## 2023-07-06 DIAGNOSIS — Z1151 Encounter for screening for human papillomavirus (HPV): Secondary | ICD-10-CM | POA: Diagnosis not present

## 2023-08-19 DIAGNOSIS — I10 Essential (primary) hypertension: Secondary | ICD-10-CM | POA: Diagnosis not present

## 2023-08-19 DIAGNOSIS — E1169 Type 2 diabetes mellitus with other specified complication: Secondary | ICD-10-CM | POA: Diagnosis not present

## 2023-09-09 DIAGNOSIS — K219 Gastro-esophageal reflux disease without esophagitis: Secondary | ICD-10-CM | POA: Diagnosis not present

## 2023-09-09 DIAGNOSIS — K9089 Other intestinal malabsorption: Secondary | ICD-10-CM | POA: Diagnosis not present

## 2023-09-09 DIAGNOSIS — R159 Full incontinence of feces: Secondary | ICD-10-CM | POA: Diagnosis not present

## 2023-09-09 DIAGNOSIS — R140 Abdominal distension (gaseous): Secondary | ICD-10-CM | POA: Diagnosis not present

## 2023-09-30 DIAGNOSIS — E1169 Type 2 diabetes mellitus with other specified complication: Secondary | ICD-10-CM | POA: Diagnosis not present

## 2023-09-30 DIAGNOSIS — I10 Essential (primary) hypertension: Secondary | ICD-10-CM | POA: Diagnosis not present

## 2023-10-20 DIAGNOSIS — E1142 Type 2 diabetes mellitus with diabetic polyneuropathy: Secondary | ICD-10-CM | POA: Diagnosis not present

## 2023-10-20 DIAGNOSIS — I10 Essential (primary) hypertension: Secondary | ICD-10-CM | POA: Diagnosis not present

## 2023-10-25 ENCOUNTER — Ambulatory Visit: Admitting: Podiatry

## 2023-10-27 ENCOUNTER — Ambulatory Visit: Admitting: Podiatry

## 2023-10-27 ENCOUNTER — Encounter: Payer: Self-pay | Admitting: Podiatry

## 2023-10-27 ENCOUNTER — Ambulatory Visit (INDEPENDENT_AMBULATORY_CARE_PROVIDER_SITE_OTHER)

## 2023-10-27 DIAGNOSIS — M778 Other enthesopathies, not elsewhere classified: Secondary | ICD-10-CM

## 2023-10-27 DIAGNOSIS — M7752 Other enthesopathy of left foot: Secondary | ICD-10-CM

## 2023-10-27 DIAGNOSIS — E1149 Type 2 diabetes mellitus with other diabetic neurological complication: Secondary | ICD-10-CM

## 2023-10-27 DIAGNOSIS — E1140 Type 2 diabetes mellitus with diabetic neuropathy, unspecified: Secondary | ICD-10-CM

## 2023-10-27 MED ORDER — TRIAMCINOLONE ACETONIDE 10 MG/ML IJ SUSP
10.0000 mg | Freq: Once | INTRAMUSCULAR | Status: AC
Start: 2023-10-27 — End: 2023-10-27
  Administered 2023-10-27: 10 mg via INTRA_ARTICULAR

## 2023-10-27 NOTE — Progress Notes (Signed)
 Subjective:   Patient ID: Jenna Nelson, female   DOB: 63 y.o.   MRN: [id number]   HPI Patient presents with the possibility for neuropathy left stating that she started develop burning pain and hurting pain and she has long-term history diabetes not in good control with A1c in the 11-12 range.  Patient does not currently smoke tries to be active   Review of Systems  All other systems reviewed and are negative.       Objective:  Physical Exam Vitals and nursing note reviewed.  Constitutional:      Appearance: She is well-developed.  Pulmonary:     Effort: Pulmonary effort is normal.  Musculoskeletal:        General: Normal range of motion.  Skin:    General: Skin is warm.  Neurological:     Mental Status: She is alert.     Neurovascular status was found to be intact from a vascular perspective neurologically she does have significant diminishment in sharp dull and vibratory.  Patient though has had this for probably real period of time does not really have symptoms associated with that currently and is found to have inflammation pain of the forefoot mostly around the 3rd and 4th metatarsal phalangeal joints.  Good digital perfusion well-oriented      Assessment:  Inflammatory capsulitis lesser MPJs left along with diabetic neuropathy     Plan:  H&P reviewed and reviewed x-ray.  I did discuss the diabetes and her poor control and the absolute importance of getting better control and at this point I did do sterile prep and injected periarticular around 3rd and 4th MPJs 3 mg dexamethasone Kenalog 5 mg Xylocaine applied sterile dressing  X-rays were negative for signs of fracture did indicate no arthritic conditions at this point either

## 2023-11-17 ENCOUNTER — Ambulatory Visit: Admitting: Podiatry

## 2023-11-19 ENCOUNTER — Encounter: Payer: Self-pay | Admitting: Podiatry

## 2023-11-19 ENCOUNTER — Ambulatory Visit: Admitting: Podiatry

## 2023-11-19 VITALS — Ht 61.0 in | Wt 170.0 lb

## 2023-11-19 DIAGNOSIS — E1149 Type 2 diabetes mellitus with other diabetic neurological complication: Secondary | ICD-10-CM

## 2023-11-19 DIAGNOSIS — M7751 Other enthesopathy of right foot: Secondary | ICD-10-CM

## 2023-11-19 DIAGNOSIS — M7752 Other enthesopathy of left foot: Secondary | ICD-10-CM | POA: Diagnosis not present

## 2023-11-19 DIAGNOSIS — E1140 Type 2 diabetes mellitus with diabetic neuropathy, unspecified: Secondary | ICD-10-CM

## 2023-11-22 NOTE — Progress Notes (Signed)
 Subjective:   Patient ID: Jenna Nelson, female   DOB: 63 y.o.   MRN: [id number]   HPI Patient states her forefoot seems to feel some better still bothersome but she is having quite a bit of discomfort in her ankle and other areas that are hard to describe with diabetes not in good control   ROS      Objective:  Physical Exam  Neurovascular status intact with inflammation pain around the sinus tarsi left fluid buildup around the joint surface and a lot of pain to palpation of the area.  Also forefoot seems somewhat better but I do think there is some nerve neurological situation also     Assessment:  Laboratory capsulitis sinus tarsi left fluid buildup along with neuropathy of a moderate nature and diabetes not in good condition     Plan:  H&P reviewed sterile prep injected the sinus tarsi left 3 mg Kenalog 5 mg Xylocaine applied sterile dressing instructed on reduced activity and reappoint to recheck

## 2023-12-07 ENCOUNTER — Telehealth: Payer: Self-pay

## 2023-12-07 NOTE — Telephone Encounter (Signed)
 Tried to call patient about status on orthotics, Orthotics were shipped on 11/29/2023. Should be in by this week or next week. Would take time to receive orthotics once shipped to the office.

## 2023-12-13 NOTE — Telephone Encounter (Signed)
 Patient orthotics are in.

## 2023-12-15 NOTE — Telephone Encounter (Signed)
 Tried to reach out to patient to let her know orthotics are in and she can schedule an appointment to pick up. LVM for her to call back and schedule the appointment with us .

## 2023-12-23 ENCOUNTER — Other Ambulatory Visit: Admitting: Podiatry

## 2023-12-28 ENCOUNTER — Ambulatory Visit (INDEPENDENT_AMBULATORY_CARE_PROVIDER_SITE_OTHER): Admitting: Podiatrist

## 2023-12-28 DIAGNOSIS — M7752 Other enthesopathy of left foot: Secondary | ICD-10-CM

## 2023-12-28 DIAGNOSIS — M778 Other enthesopathies, not elsewhere classified: Secondary | ICD-10-CM

## 2023-12-28 DIAGNOSIS — E1149 Type 2 diabetes mellitus with other diabetic neurological complication: Secondary | ICD-10-CM

## 2023-12-28 DIAGNOSIS — E1140 Type 2 diabetes mellitus with diabetic neuropathy, unspecified: Secondary | ICD-10-CM

## 2023-12-28 NOTE — Progress Notes (Signed)
